# Patient Record
Sex: Male | Born: 1950 | Race: White | Hispanic: No | Marital: Married | Smoking: Never smoker
Health system: Southern US, Community
[De-identification: ages and names within clinical notes are randomized; demographics above are authoritative.]

## PROBLEM LIST (undated history)

## (undated) DIAGNOSIS — R519 Headache, unspecified: Secondary | ICD-10-CM

## (undated) DIAGNOSIS — C801 Malignant (primary) neoplasm, unspecified: Secondary | ICD-10-CM

## (undated) DIAGNOSIS — R42 Dizziness and giddiness: Secondary | ICD-10-CM

## (undated) DIAGNOSIS — K279 Peptic ulcer, site unspecified, unspecified as acute or chronic, without hemorrhage or perforation: Secondary | ICD-10-CM

## (undated) DIAGNOSIS — E785 Hyperlipidemia, unspecified: Secondary | ICD-10-CM

## (undated) HISTORY — DX: Headache, unspecified: R51.9

## (undated) HISTORY — DX: Peptic ulcer, site unspecified, unspecified as acute or chronic, without hemorrhage or perforation: K27.9

## (undated) HISTORY — PX: INGUINAL HERNIA REPAIR: SUR1180

## (undated) HISTORY — DX: Hyperlipidemia, unspecified: E78.5

## (undated) HISTORY — DX: Dizziness and giddiness: R42

## (undated) HISTORY — DX: Malignant (primary) neoplasm, unspecified: C80.1

---

## 2018-07-25 ENCOUNTER — Other Ambulatory Visit: Payer: Self-pay | Admitting: Family Medicine

## 2018-07-25 DIAGNOSIS — Z87891 Personal history of nicotine dependence: Secondary | ICD-10-CM

## 2018-08-08 ENCOUNTER — Ambulatory Visit
Admission: RE | Admit: 2018-08-08 | Discharge: 2018-08-08 | Disposition: A | Discharge status: Home / Self Care | Payer: Medicare Other | Source: Ambulatory Visit | Attending: Family Medicine | Admitting: Family Medicine

## 2018-08-08 DIAGNOSIS — Z87891 Personal history of nicotine dependence: Secondary | ICD-10-CM

## 2019-08-10 ENCOUNTER — Other Ambulatory Visit: Payer: Self-pay

## 2019-08-10 ENCOUNTER — Ambulatory Visit: Payer: Medicare Other | Attending: Family Medicine

## 2019-08-10 DIAGNOSIS — H8111 Benign paroxysmal vertigo, right ear: Secondary | ICD-10-CM | POA: Insufficient documentation

## 2019-08-10 DIAGNOSIS — H8112 Benign paroxysmal vertigo, left ear: Secondary | ICD-10-CM | POA: Insufficient documentation

## 2019-08-10 DIAGNOSIS — R42 Dizziness and giddiness: Secondary | ICD-10-CM | POA: Diagnosis present

## 2019-08-10 DIAGNOSIS — R2689 Other abnormalities of gait and mobility: Secondary | ICD-10-CM | POA: Diagnosis present

## 2019-08-10 NOTE — Therapy (Deleted)
[facility] [address] [place], [place], [address] Phone: [phone]   Fax:  [phone]  Physical Therapy Treatment  Patient Details  Name: Gordon Jacobs MRN: [id number] Date of Birth: 1951-02-13 Referring Provider (PT): Dr. Darlina Sicilian   Encounter Date: 08/10/2019  PT End of Session - 08/10/19 1525    Visit Number  1    Number of Visits  4    Date for PT Re-Evaluation  10/09/19    Authorization Type  [employer] Medicare: progress note every 10th visit. No auth required, VL based on medical necessity    PT Start Time  1443    PT Stop Time  1520    PT Time Calculation (min)  37 min    Equipment Utilized During Treatment  Other (comment)   S prn   Activity Tolerance  Patient tolerated treatment well    Behavior During Therapy  WFL for tasks assessed/performed       Past Medical History:  Diagnosis Date  . Peptic ulcer     History reviewed. No pertinent surgical history.  There were no vitals filed for this visit.  Subjective Assessment - 08/10/19 1451    Subjective  Pt reported dizziness began on 05/15/19, woke pt out of sleep and room was spinning. Pt's balance was impaired and it resolved in a few days. Pt has performed the Epley maneuver and it has helped. He did have a few episodes in September and October. Dizziness is worse in seated position and rolling to the L side. He did have an episode during amb. in yard, and fell against door frame (black L eye). Dizziness improves with rest and Epleys treatment. Dizziness at worst: 10/10, minor episodes: 5-6/10, at rest: 0/10. Pt denied weakness, severe HA (but minor HAs with dizziness-CT of brain to be scheduled by MD), tinnitus, N/T, or LOH. Pt reported Zyrtec is helping dizziness a little.    Pertinent History  Pepcid Ulcer    Patient Stated Goals  To figure out how to alleviate dizziness, how to manage it better.    Currently in Pain?  No/denies         [facility]  PT Assessment - 08/10/19 1457      Assessment   Medical Diagnosis  BPPV    Referring Provider (PT)  Dr. Darlina Sicilian    Onset Date/Surgical Date  05/15/19    Hand Dominance  Right    Prior Therapy  none      Precautions   Precautions  Fall   one fall, when dizzy     Restrictions   Weight Bearing Restrictions  No      Balance Screen   Has the patient fallen in the past 6 months  Yes    How many times?  1    Has the patient had a decrease in activity level because of a fear of falling?   Yes   more cautious and doesn't climb ladders   Is the patient reluctant to leave their home because of a fear of falling?   No      Home Environment   Living Environment  Private residence    Living Arrangements  Spouse/significant other   wife   Available Help at Discharge  Family;Available 24 hours/day    Type of Home  House    Home Access  Stairs to enter    Entrance Stairs-Number of Steps  3    Entrance Stairs-Rails  Can reach both    Home  Layout  Two level;Able to live on main level with bedroom/bathroom   basement   Home Equipment  None      Prior Function   Level of Independence  Independent    Vocation  Retired    [employer]  recently retired     Automotive engineer, play word games, reading, watch football and basketball, walking at least 2-3x/week (3-4 miles).      Cognition   Overall Cognitive Status  Within Functional Limits for tasks assessed      Ambulation/Gait   Ambulation/Gait  Yes    Ambulation/Gait Assistance  7: Independent    Ambulation Distance (Feet)  150 Feet    Assistive device  None    Gait Pattern  Within Functional Limits;Step-through pattern    Ambulation Surface  Level;Indoor    Gait velocity  3.67f/sec. no AD         Vestibular Assessment - 08/10/19 1506      Symptom Behavior   Subjective history of current problem  See assessment    Type of Dizziness   Spinning   at first and then feels like eyes are moving   Frequency of  Dizziness  A few times a month, intermittent    Duration of Dizziness  A few minutes, then wooziness    Symptom Nature  Positional    Aggravating Factors  Lying supine;Looking up to the ceiling;Rolling to left    Relieving Factors  Rest;Comments   Epley   Progression of Symptoms  Better    History of similar episodes  No      Oculomotor Exam   Oculomotor Alignment  Normal    Spontaneous  Absent    Gaze-induced   Absent    Head shaking Horizontal  Absent    Head Shaking Vertical  Absent    Smooth Pursuits  Intact    Saccades  Intact    Comment  No dizziness reported during oculomotor exam. Convergence WNL.      Vestibulo-Ocular Reflex   VOR 1 Head Only (x 1 viewing)  WNL and no dizziness reported.     VOR Cancellation  Normal      Positional Testing   Dix-Hallpike  Dix-Hallpike Right;Dix-Hallpike Left    Horizontal Canal Testing  Horizontal Canal Right;Horizontal Canal Left      Dix-Hallpike Right   Dix-Hallpike Right Duration  0    Dix-Hallpike Right Symptoms  No nystagmus      Dix-Hallpike Left   Dix-Hallpike Left Duration  0    Dix-Hallpike Left Symptoms  No nystagmus      Horizontal Canal Right   Horizontal Canal Right Duration  0    Horizontal Canal Right Symptoms  Normal      Horizontal Canal Left   Horizontal Canal Left Duration  0    Horizontal Canal Left Symptoms  Normal                       PT Education - 08/10/19 1523    Education Details  PT educated pt on exam findings, PT POC, duration, frequency. PT educated pt on vertigo appearing to be resolved at this time but will hold pt for now and pt is to call back to schedule appt. for f/u if dizziness returns in the next month (will keep episode open for two months max).    Person(s) Educated  Patient    Methods  Explanation    Comprehension  Verbalized understanding  PT Short Term Goals - 08/10/19 1530      PT SHORT TERM GOAL #1   Title  same as LTGs        PT Long Term Goals  - 08/10/19 1530      PT LONG TERM GOAL #1   Title  Perform positional testing and write goals as indicated. TARGET DATE FOR ALL LTGS:09/07/19    Time  4    Period  Weeks    Status  New      PT LONG TERM GOAL #2   Title  Perform FGA and write goal as indicated.    Time  4    Period  Weeks    Status  New      PT LONG TERM GOAL #3   Title  Pt will report no dizziness for 2 weeks to improve QOL and safety during functional mobility.    Time  4    Period  Weeks    Status  New            Plan - 08/10/19 1527    Clinical Impression Statement  Pt is a pleasant 68y/o male presenting to OPPT neuro for BPPV. Pt's PMH is significant for the following: pepcid ulcer. Pt's exam findings were negative for dizziness, as pt appears it has resolved with pt performing Epley maneuver at home. Pt's gait speed was WNL, and indicated he's able to safely amb. in the community. Pt's only fall was 2/2 dizziness. PT will hold this episode open for two months, and pt can come in for treatment at that time. Pt would benefit from skilled therapy if dizziness returns to ensure safety.    Personal Factors and Comorbidities  Comorbidity 1    Examination-Activity Limitations  Bed Mobility;Locomotion Level    Examination-Participation Restrictions  Yard Work    Stability/Clinical Decision Making  Stable/Uncomplicated    Clinical Decision Making  Low    Rehab Potential  Excellent    PT Frequency  1x / week    PT Duration  3 weeks    PT Treatment/Interventions  ADLs/Self Care Home Management;Neuromuscular re-education;Canalith Repostioning;Balance training;Therapeutic exercise;Therapeutic activities;Functional mobility training;Stair training;Gait training;Vestibular;Patient/family education    PT Next Visit Plan  Reassess for BPPV if dizziness returns and perform FGA as indicated.    Consulted and Agree with Plan of Care  Patient       Patient will benefit from skilled therapeutic intervention in order to  improve the following deficits and impairments:  Dizziness, Decreased balance  Visit Diagnosis: Dizziness and giddiness  BPPV (benign paroxysmal positional vertigo), left  BPPV (benign paroxysmal positional vertigo), right  Other abnormalities of gait and mobility     Problem List There are no active problems to display for this patient.   Abed Schar L 08/10/2019, 3:32 PM  [facility] [address] [place], [place], [address] Phone: [phone]   Fax:  [phone]  Name: Eliot Popper MRN: [id number] Date of Birth: 1951/01/13

## 2019-08-10 NOTE — Therapy (Signed)
[facility] [address] [place], [place], [address] Phone: [phone]   Fax:  [phone]  Physical Therapy Evaluation  Patient Details  Name: Gordon Jacobs MRN: [id number] Date of Birth: 26-Dec-1950 Referring Provider (PT): Dr. Darlina Sicilian   Encounter Date: 08/10/2019  PT End of Session - 08/10/19 1525    Visit Number  1    Number of Visits  4    Date for PT Re-Evaluation  10/09/19    Authorization Type  [employer] Medicare: progress note every 10th visit. No auth required, VL based on medical necessity    PT Start Time  1443    PT Stop Time  1520    PT Time Calculation (min)  37 min    Equipment Utilized During Treatment  Other (comment)   S prn   Activity Tolerance  Patient tolerated treatment well    Behavior During Therapy  WFL for tasks assessed/performed       Past Medical History:  Diagnosis Date  . Peptic ulcer     History reviewed. No pertinent surgical history.  There were no vitals filed for this visit.   Subjective Assessment - 08/10/19 1451    Subjective  Pt reported dizziness began on 05/15/19, woke pt out of sleep and room was spinning. Pt's balance was impaired and it resolved in a few days. Pt reported last dizzy episode was a few weeks ago. Pt has performed the Epley maneuver and it has helped. He did have a few episodes in September and October. Dizziness is worse in seated position and rolling to the L side. He did have an episode during amb. in yard, and fell against door frame (black L eye). Dizziness improves with rest and Epleys treatment. Dizziness at worst: 10/10, minor episodes: 5-6/10, at rest: 0/10. Pt denied weakness, severe HA (but minor HAs with dizziness-CT of brain to be scheduled by MD), tinnitus, N/T, or LOH. Pt reported Zyrtec is helping dizziness a little.    Pertinent History  Pepcid Ulcer    Patient Stated Goals  To figure out how to alleviate dizziness, how to manage it  better.    Currently in Pain?  No/denies         [facility] PT Assessment - 08/10/19 1457      Assessment   Medical Diagnosis  BPPV    Referring Provider (PT)  Dr. Darlina Sicilian    Onset Date/Surgical Date  05/15/19    Hand Dominance  Right    Prior Therapy  none      Precautions   Precautions  Fall   one fall, when dizzy     Restrictions   Weight Bearing Restrictions  No      Balance Screen   Has the patient fallen in the past 6 months  Yes    How many times?  1    Has the patient had a decrease in activity level because of a fear of falling?   Yes   more cautious and doesn't climb ladders   Is the patient reluctant to leave their home because of a fear of falling?   No      Home Environment   Living Environment  Private residence    Living Arrangements  Spouse/significant other   wife   Available Help at Discharge  Family;Available 24 hours/day    Type of Home  House    Home Access  Stairs to enter    Entrance Stairs-Number of Steps  3  Entrance Stairs-Rails  Can reach both    Home Layout  Two level;Able to live on main level with bedroom/bathroom   basement   Home Equipment  None      Prior Function   Level of Independence  Independent    Vocation  Retired    [employer]  recently retired     Automotive engineer, play word games, reading, watch football and basketball, walking at least 2-3x/week (3-4 miles).      Cognition   Overall Cognitive Status  Within Functional Limits for tasks assessed      Ambulation/Gait   Ambulation/Gait  Yes    Ambulation/Gait Assistance  7: Independent    Ambulation Distance (Feet)  150 Feet    Assistive device  None    Gait Pattern  Within Functional Limits;Step-through pattern    Ambulation Surface  Level;Indoor    Gait velocity  3.53f/sec. no AD           Vestibular Assessment - 08/10/19 1506      Symptom Behavior   Subjective history of current problem  See assessment    Type of Dizziness   Spinning   at  first and then feels like eyes are moving   Frequency of Dizziness  A few times a month, intermittent    Duration of Dizziness  A few minutes, then wooziness    Symptom Nature  Positional    Aggravating Factors  Lying supine;Looking up to the ceiling;Rolling to left    Relieving Factors  Rest;Comments   Epley   Progression of Symptoms  Better    History of similar episodes  No      Oculomotor Exam   Oculomotor Alignment  Normal    Spontaneous  Absent    Gaze-induced   Absent    Head shaking Horizontal  Absent    Head Shaking Vertical  Absent    Smooth Pursuits  Intact    Saccades  Intact    Comment  No dizziness reported during oculomotor exam. Convergence WNL.      Vestibulo-Ocular Reflex   VOR 1 Head Only (x 1 viewing)  WNL and no dizziness reported.     VOR Cancellation  Normal      Positional Testing   Dix-Hallpike  Dix-Hallpike Right;Dix-Hallpike Left    Horizontal Canal Testing  Horizontal Canal Right;Horizontal Canal Left      Dix-Hallpike Right   Dix-Hallpike Right Duration  0    Dix-Hallpike Right Symptoms  No nystagmus      Dix-Hallpike Left   Dix-Hallpike Left Duration  0    Dix-Hallpike Left Symptoms  No nystagmus      Horizontal Canal Right   Horizontal Canal Right Duration  0    Horizontal Canal Right Symptoms  Normal      Horizontal Canal Left   Horizontal Canal Left Duration  0    Horizontal Canal Left Symptoms  Normal          Objective measurements completed on examination: See above findings.              PT Education - 08/10/19 1523    Education Details  PT educated pt on exam findings, PT POC, duration, frequency. PT educated pt on vertigo appearing to be resolved at this time but will hold pt for now and pt is to call back to schedule appt. for f/u if dizziness returns in the next month (will keep episode open for two months max).  Person(s) Educated  Patient    Methods  Explanation    Comprehension  Verbalized understanding        PT Short Term Goals - 08/10/19 1530      PT SHORT TERM GOAL #1   Title  same as LTGs        PT Long Term Goals - 08/10/19 1530      PT LONG TERM GOAL #1   Title  Perform positional testing and write goals as indicated. TARGET DATE FOR ALL LTGS:09/07/19    Time  4    Period  Weeks    Status  New      PT LONG TERM GOAL #2   Title  Perform FGA and write goal as indicated.    Time  4    Period  Weeks    Status  New      PT LONG TERM GOAL #3   Title  Pt will report no dizziness for 2 weeks to improve QOL and safety during functional mobility.    Time  4    Period  Weeks    Status  New             Plan - 08/10/19 1527    Clinical Impression Statement  Pt is a pleasant 68y/o male presenting to OPPT neuro for BPPV. Pt's PMH is significant for the following: pepcid ulcer. Pt's exam findings were negative for dizziness, as pt appears it has resolved with pt performing Epley maneuver at home. Pt's gait speed was WNL, and indicated he's able to safely amb. in the community. Pt's only fall was 2/2 dizziness. PT will hold this episode open for two months, and pt can come in for treatment at that time. Pt would benefit from skilled therapy if dizziness returns to ensure safety.    Personal Factors and Comorbidities  Comorbidity 1    Examination-Activity Limitations  Bed Mobility;Locomotion Level    Examination-Participation Restrictions  Yard Work    Stability/Clinical Decision Making  Stable/Uncomplicated    Clinical Decision Making  Low    Rehab Potential  Excellent    PT Frequency  1x / week    PT Duration  3 weeks    PT Treatment/Interventions  ADLs/Self Care Home Management;Neuromuscular re-education;Canalith Repostioning;Balance training;Therapeutic exercise;Therapeutic activities;Functional mobility training;Stair training;Gait training;Vestibular;Patient/family education    PT Next Visit Plan  Reassess for BPPV if dizziness returns and perform FGA as indicated.     Consulted and Agree with Plan of Care  Patient       Patient will benefit from skilled therapeutic intervention in order to improve the following deficits and impairments:  Dizziness, Decreased balance  Visit Diagnosis: Dizziness and giddiness  BPPV (benign paroxysmal positional vertigo), left  BPPV (benign paroxysmal positional vertigo), right  Other abnormalities of gait and mobility     Problem List There are no active problems to display for this patient.   Arsh Feutz L 08/10/2019, 3:33 PM  [facility] [address] [place], [place], [address] Phone: [phone]   Fax:  [phone]  Name: Tryce Surratt MRN: [id number] Date of Birth: 1951/03/04   Geoffry Paradise, PT,DPT 08/10/19 3:34 PM Phone: [phone] Fax: [phone]

## 2019-08-18 ENCOUNTER — Other Ambulatory Visit: Payer: Self-pay | Admitting: Family Medicine

## 2019-08-18 DIAGNOSIS — R42 Dizziness and giddiness: Secondary | ICD-10-CM

## 2019-08-27 ENCOUNTER — Ambulatory Visit
Admission: RE | Admit: 2019-08-27 | Discharge: 2019-08-27 | Disposition: A | Discharge status: Home / Self Care | Payer: Medicare Other | Source: Ambulatory Visit | Attending: Family Medicine | Admitting: Family Medicine

## 2019-08-27 DIAGNOSIS — R42 Dizziness and giddiness: Secondary | ICD-10-CM

## 2019-09-08 ENCOUNTER — Other Ambulatory Visit: Payer: Self-pay

## 2019-09-08 ENCOUNTER — Ambulatory Visit: Payer: Medicare Other | Admitting: Internal Medicine

## 2019-09-08 ENCOUNTER — Encounter: Payer: Self-pay | Admitting: Internal Medicine

## 2019-09-08 VITALS — BP 126/72 | HR 78 | Temp 97.3°F | Ht 75.0 in | Wt 211.0 lb

## 2019-09-08 DIAGNOSIS — K921 Melena: Secondary | ICD-10-CM

## 2019-09-08 DIAGNOSIS — Z1211 Encounter for screening for malignant neoplasm of colon: Secondary | ICD-10-CM

## 2019-09-08 DIAGNOSIS — K625 Hemorrhage of anus and rectum: Secondary | ICD-10-CM

## 2019-09-08 DIAGNOSIS — R1013 Epigastric pain: Secondary | ICD-10-CM | POA: Diagnosis not present

## 2019-09-08 DIAGNOSIS — Z1159 Encounter for screening for other viral diseases: Secondary | ICD-10-CM

## 2019-09-08 MED ORDER — SUPREP BOWEL PREP KIT 17.5-3.13-1.6 GM/177ML PO SOLN: ORAL | 0 refills | Status: DC

## 2019-09-08 NOTE — Patient Instructions (Addendum)
If you are age 66 or older, your body mass index should be between 23-30. Your Body mass index is 26.37 kg/m. If this is out of the aforementioned range listed, please consider follow up with your Primary Care Provider.  You have been scheduled for an endoscopy and colonoscopy. Please follow the written instructions given to you at your visit today. Please pick up your prep supplies at the pharmacy within the next 1-3 days. If you use inhalers (even only as needed), please bring them with you on the day of your procedure. Your physician has requested that you go to www.startemmi.com and enter the access code given to you at your visit today. This web site gives a general overview about your procedure. However, you should still follow specific instructions given to you by our office regarding your preparation for the procedure.  Continue your Pantoprazole 40mg  once daily.  Please avoid all NSAIDs.   Thank you for entrusting me with your care and for choosing [facility], Dr. Zenovia Jarred

## 2019-09-08 NOTE — Progress Notes (Signed)
Patient ID: Gordon Jacobs, male   DOB: Sep 09, 1951, 68 y.o.   MRN: [id number] HPI: Gordon Jacobs is a 68 year old male with recent epigastric abdominal pain with black tarry stool who is seen in consult at the request of Dr. Darlina Sicilian to discuss epigastric abdominal pain, dark stools and rare rectal bleeding.  He is here alone today.  He reports he had a screening colonoscopy years ago around age 45.  Over the last few months he has developed epigastric abdominal pain and black tarry stool.  Earlier this month he was started on pantoprazole 40 mg a day per primary care.  With this his epigastric pain and melena have resolved.  Prior to this he was having rare heartburn 1 to 2 days/month.  This is also resolved.  He will occasionally see red blood with wiping and in the toilet.  His bowel movements occur 1-2 times in the morning and have been consistent.  No dramatic changes in bowel habit.  No constipation.  Stable weight and appetite.  No esophageal dysphagia or odynophagia.  He was previously using Advil 2 or 3 times per week but has stopped this and is using Tylenol for joint pains as needed.  No family history of colon cancer.  His brother recently had a precancerous polyp at age 20.  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  Past smoker.  Occasional alcohol use.  Prior inguinal hernia repair  Past Medical History:  Diagnosis Date  . Borderline hyperlipidemia   . Frequent headaches   . Peptic ulcer   . Vertigo     Past Surgical History:  Procedure Laterality Date  . INGUINAL HERNIA REPAIR      Outpatient Medications Prior to Visit  Medication Sig Dispense Refill  . meclizine (ANTIVERT) 25 MG tablet Take 25 mg by mouth 3 (three) times daily as needed for dizziness.    . pantoprazole (PROTONIX) 40 MG tablet Take 40 mg by mouth daily.    . cetirizine (ZYRTEC) 10 MG tablet Take 10 mg by mouth daily.    . naproxen  sodium (ALEVE) 220 MG tablet Take 220 mg by mouth daily as needed.    . vitamin B-12 (CYANOCOBALAMIN) 100 MCG tablet Take 100 mcg by mouth daily.     No facility-administered medications prior to visit.     Allergies  Allergen Reactions  . Strawberry (Diagnostic) Anaphylaxis    Family History  Problem Relation Age of Onset  . Stomach cancer Mother   . Liver cancer Mother   . Ovarian cancer Mother   . Breast cancer Sister   . Colon polyps Brother     Social History   Tobacco Use  . Smoking status: Never Smoker  . Smokeless tobacco: Never Used  Substance Use Topics  . Alcohol use: Yes    Comment: at most, 5  beers in a week  . Drug use: Never    ROS: As per history of present illness, otherwise negative  BP 126/72   Pulse 78   Temp (!) 97.3 F (36.3 C)   Ht 6\' 3"  (1.905 m)   Wt 211 lb (95.7 kg)   BMI 26.37 kg/m  Constitutional: Well-developed and well-nourished. No distress. HEENT: Normocephalic and atraumatic.Conjunctivae are normal.  No scleral icterus. Neck: Neck supple. Trachea midline. Cardiovascular: Normal rate, regular rhythm and intact distal pulses. No M/R/G Pulmonary/chest: Effort normal and breath sounds normal. No wheezing, rales or rhonchi. Abdominal: Soft, nontender,  nondistended. Bowel sounds active throughout. There are no masses palpable. No hepatosplenomegaly. Extremities: no clubbing, cyanosis, or edema Neurological: Alert and oriented to person place and time. Skin: Skin is warm and dry.  Psychiatric: Normal mood and affect. Behavior is normal.   ASSESSMENT/PLAN: 68 year old male with recent epigastric abdominal pain with black tarry stool who is seen in consult at the request of Dr. Darlina Sicilian to discuss epigastric abdominal pain, dark stools and rare rectal bleeding.  1. Epigastric pain/heartburn/melena --he was diagnosed clinically with ulcer and started on PPI nearly 1 month ago.  Symptoms have improved.  I recommended we pursue  upper endoscopy to evaluate the esophagus, stomach and duodenum to rule out esophagitis, gastroduodenitis, peptic ulcer disease and H. pylori.  We discussed the risk, benefits and alternatives and he is agreeable and wishes to proceed --Upper endoscopy --Continue pantoprazole 40 mg daily --Continue NSAID avoidance  2.  Colon cancer screening/rare rectal bleeding --I recommended colonoscopy for screening.  His painless red blood is intermittent and may be hemorrhoidal in nature but certainly colonoscopy is warranted.  We discussed the risk, benefits and alternatives and he is agreeable and wishes to proceed      [id number], [place], [place],  [place] [id number]

## 2019-09-10 ENCOUNTER — Encounter: Payer: Self-pay | Admitting: Internal Medicine

## 2019-09-15 ENCOUNTER — Other Ambulatory Visit: Payer: Self-pay | Admitting: Internal Medicine

## 2019-09-15 ENCOUNTER — Ambulatory Visit (INDEPENDENT_AMBULATORY_CARE_PROVIDER_SITE_OTHER): Payer: Medicare Other

## 2019-09-15 DIAGNOSIS — Z1159 Encounter for screening for other viral diseases: Secondary | ICD-10-CM

## 2019-09-16 LAB — SARS CORONAVIRUS 2 (TAT 6-24 HRS): SARS Coronavirus 2: NEGATIVE

## 2019-09-17 ENCOUNTER — Other Ambulatory Visit: Payer: Self-pay

## 2019-09-17 ENCOUNTER — Encounter: Payer: Self-pay | Admitting: Internal Medicine

## 2019-09-17 ENCOUNTER — Ambulatory Visit (AMBULATORY_SURGERY_CENTER): Payer: Medicare Other | Admitting: Internal Medicine

## 2019-09-17 VITALS — BP 118/70 | HR 75 | Temp 97.6°F | Resp 20 | Ht 75.0 in | Wt 211.0 lb

## 2019-09-17 DIAGNOSIS — D123 Benign neoplasm of transverse colon: Secondary | ICD-10-CM | POA: Diagnosis not present

## 2019-09-17 DIAGNOSIS — K2970 Gastritis, unspecified, without bleeding: Secondary | ICD-10-CM

## 2019-09-17 DIAGNOSIS — Z1211 Encounter for screening for malignant neoplasm of colon: Secondary | ICD-10-CM | POA: Diagnosis not present

## 2019-09-17 DIAGNOSIS — B9681 Helicobacter pylori [H. pylori] as the cause of diseases classified elsewhere: Secondary | ICD-10-CM | POA: Diagnosis not present

## 2019-09-17 DIAGNOSIS — D128 Benign neoplasm of rectum: Secondary | ICD-10-CM | POA: Diagnosis not present

## 2019-09-17 DIAGNOSIS — K2950 Unspecified chronic gastritis without bleeding: Secondary | ICD-10-CM

## 2019-09-17 DIAGNOSIS — D124 Benign neoplasm of descending colon: Secondary | ICD-10-CM

## 2019-09-17 DIAGNOSIS — D125 Benign neoplasm of sigmoid colon: Secondary | ICD-10-CM

## 2019-09-17 DIAGNOSIS — K2980 Duodenitis without bleeding: Secondary | ICD-10-CM | POA: Diagnosis not present

## 2019-09-17 DIAGNOSIS — R1013 Epigastric pain: Secondary | ICD-10-CM

## 2019-09-17 DIAGNOSIS — K921 Melena: Secondary | ICD-10-CM

## 2019-09-17 MED ORDER — SODIUM CHLORIDE 0.9 % IV SOLN: 500.0000 mL | Freq: Once | INTRAVENOUS | Status: DC

## 2019-09-17 NOTE — Op Note (Addendum)
[place] Patient Name: Gordon Jacobs Procedure Date: 09/17/2019 10:05 AM MRN: [id number] Endoscopist: Jerene Bears , MD Age: 68 Referring MD:  Date of Birth: 11/25/1950 Gender: Male Account #: [id number] Procedure:                Colonoscopy Indications:              Screening for colorectal malignant neoplasm (last                            colonoscopy > 10 years ago) Medicines:                Monitored Anesthesia Care Procedure:                Pre-Anesthesia Assessment:                           - Prior to the procedure, a History and Physical                            was performed, and patient medications and                            allergies were reviewed. The patient's tolerance of                            previous anesthesia was also reviewed. The risks                            and benefits of the procedure and the sedation                            options and risks were discussed with the patient.                            All questions were answered, and informed consent                            was obtained. Prior Anticoagulants: The patient has                            taken no previous anticoagulant or antiplatelet                            agents. ASA Grade Assessment: II - A patient with                            mild systemic disease. After reviewing the risks                            and benefits, the patient was deemed in                            satisfactory condition to undergo the procedure.                           -  Prior to the procedure, a History and Physical                            was performed, and patient medications and                            allergies were reviewed. The patient's tolerance of                            previous anesthesia was also reviewed. The risks                            and benefits of the procedure and the sedation                            options and risks were discussed with the  patient.                            All questions were answered, and informed consent                            was obtained. Prior Anticoagulants: The patient has                            taken no previous anticoagulant or antiplatelet                            agents. ASA Grade Assessment: II - A patient with                            mild systemic disease. After reviewing the risks                            and benefits, the patient was deemed in                            satisfactory condition to undergo the procedure.                           After obtaining informed consent, the colonoscope                            was passed under direct vision. Throughout the                            procedure, the patient's blood pressure, pulse, and                            oxygen saturations were monitored continuously. The                            Colonoscope was introduced through the anus and  advanced to the cecum, identified by appendiceal                            orifice and ileocecal valve. The colonoscopy was                            performed without difficulty. The patient tolerated                            the procedure well. The quality of the bowel                            preparation was good. The ileocecal valve,                            appendiceal orifice, and rectum were photographed. Scope In: 10:18:12 AM Scope Out: 10:42:06 AM Scope Withdrawal Time: 0 hours 21 minutes 35 seconds  Total Procedure Duration: 0 hours 23 minutes 54 seconds  Findings:                 Hemorrhoids were found on perianal exam.                           Two sessile polyps were found in the transverse                            colon. The polyps were 4 to 6 mm in size. These                            polyps were removed with a cold snare. Resection                            and retrieval were complete.                           A 5 mm polyp was  found in the descending colon. The                            polyp was sessile. The polyp was removed with a                            cold snare. Resection and retrieval were complete.                           Two pedunculated polyps were found in the sigmoid                            colon. The polyps were 7 to 10 mm in size. These                            polyps were removed with a hot snare. Resection and  retrieval were complete.                           A 7 mm polyp was found in the rectum. The polyp was                            pedunculated. The polyp was removed with a hot                            snare. Resection and retrieval were complete.                           Many small and large-mouthed diverticula were found                            in the sigmoid colon and descending colon.                           External and internal hemorrhoids were found during                            retroflexion and during digital exam. Complications:            No immediate complications. Estimated Blood Loss:     Estimated blood loss was minimal. Impression:               - Hemorrhoids found on perianal exam.                           - Two 4 to 6 mm polyps in the transverse colon,                            removed with a cold snare. Resected and retrieved.                           - One 5 mm polyp in the descending colon, removed                            with a cold snare. Resected and retrieved.                           - Two 7 to 10 mm polyps in the sigmoid colon,                            removed with a hot snare. Resected and retrieved.                           - One 7 mm polyp in the rectum, removed with a hot                            snare. Resected and retrieved.                           - Severe  diverticulosis in the sigmoid colon and in                            the descending colon.                           - External and  internal hemorrhoids. Recommendation:           - Patient has a contact number available for                            emergencies. The signs and symptoms of potential                            delayed complications were discussed with the                            patient. Return to normal activities tomorrow.                            Written discharge instructions were provided to the                            patient.                           - Resume previous diet.                           - Continue present medications.                           - Await pathology results.                           - Repeat colonoscopy is recommended for                            surveillance. The colonoscopy date will be                            determined after pathology results from today's                            exam become available for review.                           - No ibuprofen, naproxen, or other non-steroidal                            anti-inflammatory drugs for 2 weeks after polyp                            removal. Jerene Bears, MD 09/17/2019 10:51:10 AM This report has been signed electronically.

## 2019-09-17 NOTE — Progress Notes (Signed)
PT taken to PACU. Monitors in place. VSS. Report given to RN. 

## 2019-09-17 NOTE — Patient Instructions (Signed)
6 polyps removed today.  Dr Hilarie Fredrickson will mail you a letter when he gets the pathology report.  Usually it takes 1-3 weeks to receive this letter. Diverticulosis was noted.  Avoid constipation to keep these pouches healthy. You have gastritis in your stomach.  You were tested for an infection.  We will have those results in the above mentioned pathology.    YOU HAD AN ENDOSCOPIC PROCEDURE TODAY AT [facility]:   Refer to the procedure report that was given to you for any specific questions about what was found during the examination.  If the procedure report does not answer your questions, please call your gastroenterologist to clarify.  If you requested that your care partner not be given the details of your procedure findings, then the procedure report has been included in a sealed envelope for you to review at your convenience later.  YOU SHOULD EXPECT: Some feelings of bloating in the abdomen. Passage of more gas than usual.  Walking can help get rid of the air that was put into your GI tract during the procedure and reduce the bloating. If you had a lower endoscopy (such as a colonoscopy or flexible sigmoidoscopy) you may notice spotting of blood in your stool or on the toilet paper. If you underwent a bowel prep for your procedure, you may not have a normal bowel movement for a few days.  Please Note:  You might notice some irritation and congestion in your nose or some drainage.  This is from the oxygen used during your procedure.  There is no need for concern and it should clear up in a day or so.  SYMPTOMS TO REPORT IMMEDIATELY:   Following lower endoscopy (colonoscopy or flexible sigmoidoscopy):  Excessive amounts of blood in the stool  Significant tenderness or worsening of abdominal pains  Swelling of the abdomen that is new, acute  Fever of 100F or higher   Following upper endoscopy (EGD)  Vomiting of blood or coffee ground material  New chest pain or pain under  the shoulder blades  Painful or persistently difficult swallowing  New shortness of breath  Fever of 100F or higher  Black, tarry-looking stools  For urgent or emergent issues, a gastroenterologist can be reached at any hour by calling [phone].   DIET:  We do recommend a small meal at first, but then you may proceed to your regular diet.  Drink plenty of fluids but you should avoid alcoholic beverages for 24 hours.  ACTIVITY:  You should plan to take it easy for the rest of today and you should NOT DRIVE or use heavy machinery until tomorrow (because of the sedation medicines used during the test).    FOLLOW UP: Our staff will call the number listed on your records 48-72 hours following your procedure to check on you and address any questions or concerns that you may have regarding the information given to you following your procedure. If we do not reach you, we will leave a message.  We will attempt to reach you two times.  During this call, we will ask if you have developed any symptoms of COVID 19. If you develop any symptoms (ie: fever, flu-like symptoms, shortness of breath, cough etc.) before then, please call [phone].  If you test positive for Covid 19 in the 2 weeks post procedure, please call and report this information to [place].    If any biopsies were taken you will be contacted by phone or by letter  within the next 1-3 weeks.  Please call us at [phone] if you have not heard about the biopsies in 3 weeks.    SIGNATURES/CONFIDENTIALITY: You and/or your care partner have signed paperwork which will be entered into your electronic medical record.  These signatures attest to the fact that that the information above on your After Visit Summary has been reviewed and is understood.  Full responsibility of the confidentiality of this discharge information lies with you and/or your care-partner.

## 2019-09-17 NOTE — Progress Notes (Signed)
Called to room to assist during endoscopic procedure.  Patient ID and intended procedure confirmed with present staff. Received instructions for my participation in the procedure from the performing physician.  

## 2019-09-17 NOTE — Progress Notes (Addendum)
Temp taken by JB VS taken by DT 

## 2019-09-17 NOTE — Op Note (Signed)
[name] Procedure Date: 09/17/2019 10:05 AM MRN: [id number] Endoscopist: Jerene Bears , MD Age: 68 Referring MD:  Date of Birth: 1951/09/08 Gender: Male Account #: [id number] Procedure:                Upper GI endoscopy Indications:              Epigastric abdominal pain, Heartburn, Melena Medicines:                Monitored Anesthesia Care Procedure:                Pre-Anesthesia Assessment:                           - Prior to the procedure, a History and Physical                            was performed, and patient medications and                            allergies were reviewed. The patient's tolerance of                            previous anesthesia was also reviewed. The risks                            and benefits of the procedure and the sedation                            options and risks were discussed with the patient.                            All questions were answered, and informed consent                            was obtained. Prior Anticoagulants: The patient has                            taken no previous anticoagulant or antiplatelet                            agents. ASA Grade Assessment: II - A patient with                            mild systemic disease. After reviewing the risks                            and benefits, the patient was deemed in                            satisfactory condition to undergo the procedure.                           After obtaining informed consent, the endoscope was  passed under direct vision. Throughout the                            procedure, the patient's blood pressure, pulse, and                            oxygen saturations were monitored continuously. The                            Endoscope was introduced through the mouth, and                            advanced to the third part of duodenum. The upper                            GI endoscopy  was accomplished without difficulty.                            The patient tolerated the procedure well. Scope In: Scope Out: Findings:                 The examined esophagus was normal.                           The gastroesophageal flap valve was visualized                            endoscopically and classified as Hill Grade II                            (fold present, opens with respiration).                           Mild inflammation characterized by erythema was                            found in the cardia and in the gastric fundus.                            Biopsies were taken with a cold forceps for                            histology and Helicobacter pylori testing.                           Mild inflammation characterized by erythema was                            found in the duodenal bulb.                           The second portion of the duodenum was normal. Complications:            No immediate complications. Estimated Blood Loss:     Estimated blood loss was minimal. Impression:               -  Normal esophagus.                           - Gastritis. Biopsied.                           - Duodenitis.                           - Normal second portion of the duodenum. Recommendation:           - Patient has a contact number available for                            emergencies. The signs and symptoms of potential                            delayed complications were discussed with the                            patient. Return to normal activities tomorrow.                            Written discharge instructions were provided to the                            patient.                           - Resume previous diet.                           - Continue present medications including                            pantoprazole until pathology results available.                           - Await pathology results. Jerene Bears, MD 09/17/2019 10:47:01 AM This report  has been signed electronically.

## 2019-09-21 ENCOUNTER — Telehealth: Payer: Self-pay | Admitting: *Deleted

## 2019-09-21 NOTE — Telephone Encounter (Signed)
  Follow up Call-  Call back number 09/17/2019  Post procedure Call Back phone  # [phone]  Permission to leave phone message Yes  Some recent data might be hidden     Patient questions:  Do you have a fever, pain , or abdominal swelling? No. Pain Score  0 *  Have you tolerated food without any problems? Yes.    Have you been able to return to your normal activities? Yes.    Do you have any questions about your discharge instructions: Diet   No. Medications  No. Follow up visit  No.  Do you have questions or concerns about your Care? No.  Actions: * If pain score is 4 or above: No action needed, pain <4.  1. Have you developed a fever since your procedure? no  2.   Have you had an respiratory symptoms (SOB or cough) since your procedure? no  3.   Have you tested positive for COVID 19 since your procedure no  4.   Have you had any family members/close contacts diagnosed with the COVID 19 since your procedure?  no   If yes to any of these questions please route to Joylene John, RN and Alphonsa Gin, Therapist, sports.

## 2019-09-23 ENCOUNTER — Encounter: Payer: Self-pay | Admitting: Internal Medicine

## 2019-09-25 ENCOUNTER — Other Ambulatory Visit: Payer: Self-pay

## 2019-09-25 MED ORDER — PANTOPRAZOLE SODIUM 40 MG PO TBEC: 40.0000 mg | DELAYED_RELEASE_TABLET | Freq: Two times a day (BID) | ORAL | 0 refills | Status: DC

## 2019-09-25 MED ORDER — BIS SUBCIT-METRONID-TETRACYC 140-125-125 MG PO CAPS: 3.0000 | ORAL_CAPSULE | Freq: Three times a day (TID) | ORAL | 0 refills | Status: DC

## 2019-09-28 ENCOUNTER — Telehealth: Payer: Self-pay | Admitting: *Deleted

## 2019-09-28 ENCOUNTER — Encounter: Payer: Self-pay | Admitting: *Deleted

## 2019-09-28 MED ORDER — METRONIDAZOLE 250 MG PO TABS: 250.0000 mg | ORAL_TABLET | Freq: Two times a day (BID) | ORAL | 0 refills | Status: AC

## 2019-09-28 MED ORDER — BISMUTH SUBSALICYLATE 262 MG PO CHEW: 524.0000 mg | CHEWABLE_TABLET | Freq: Four times a day (QID) | ORAL | 0 refills | Status: AC

## 2019-09-28 MED ORDER — DOXYCYCLINE HYCLATE 100 MG PO TABS: 100.0000 mg | ORAL_TABLET | Freq: Two times a day (BID) | ORAL | 0 refills | Status: AC

## 2019-09-28 MED ORDER — OMEPRAZOLE 40 MG PO CPDR: 40.0000 mg | DELAYED_RELEASE_CAPSULE | Freq: Two times a day (BID) | ORAL | 0 refills | Status: DC

## 2019-09-28 NOTE — Telephone Encounter (Signed)
I have spoken to patient to advise that his insurance will not cover Pylera. Unfortunately, we will have to break down Pylera into several different medications. Rx's have been sent to pharmacy. Patient was already sent and picked up rx for pantoprazole so he will take that rather than omeprazole PPI usually given. I have contacted the pharmacy to discontinue pylera script. I have also sent a mychart message to patient with a breakdown of medications and how to take them. He indicates he will call us back with any questions.

## 2019-10-14 ENCOUNTER — Other Ambulatory Visit: Payer: Self-pay | Admitting: Internal Medicine

## 2019-10-14 ENCOUNTER — Other Ambulatory Visit: Payer: Self-pay

## 2019-10-14 MED ORDER — PANTOPRAZOLE SODIUM 40 MG PO TBEC: 40.0000 mg | DELAYED_RELEASE_TABLET | Freq: Every day | ORAL | 1 refills | Status: DC

## 2019-12-08 ENCOUNTER — Other Ambulatory Visit: Payer: Self-pay

## 2019-12-08 DIAGNOSIS — A048 Other specified bacterial intestinal infections: Secondary | ICD-10-CM

## 2019-12-24 ENCOUNTER — Other Ambulatory Visit: Payer: Medicare Other

## 2019-12-24 DIAGNOSIS — A048 Other specified bacterial intestinal infections: Secondary | ICD-10-CM

## 2019-12-25 LAB — HELICOBACTER PYLORI  SPECIAL ANTIGEN
MICRO NUMBER:: 10266188
SPECIMEN QUALITY: ADEQUATE

## 2021-03-23 IMAGING — CT CT HEAD W/O CM
3 of 4 series · 15 of 47 positions shown, 18 images · non-contrast
Comparison: None.

CLINICAL DATA: Multiple episodes of vertigo and headache for 3
months.

EXAM:
CT HEAD WITHOUT CONTRAST
TECHNIQUE: Contiguous axial images were obtained from the base of the skull
through the vertex without intravenous contrast.

[Series 2: head 5.00 hr40 s3 axial ibhc · axial · 0.51mm/px · z∈[-572,-417]mm · 9 of 37 slices shown, 12 images]
[im 3/37  brain]
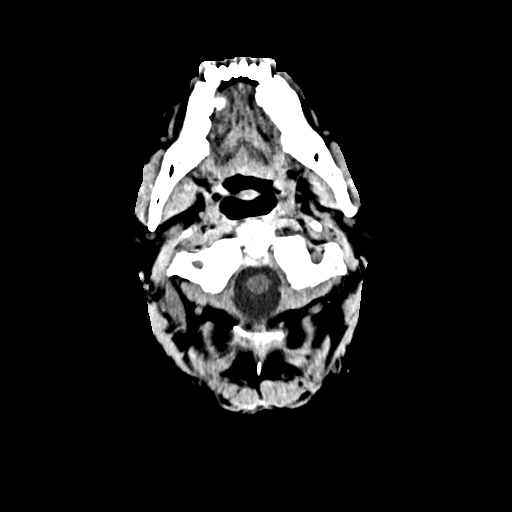
[im 3/37  bone]
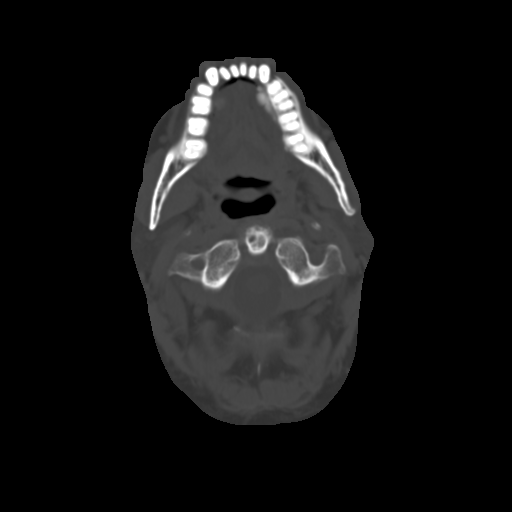
[im 8/37  brain]
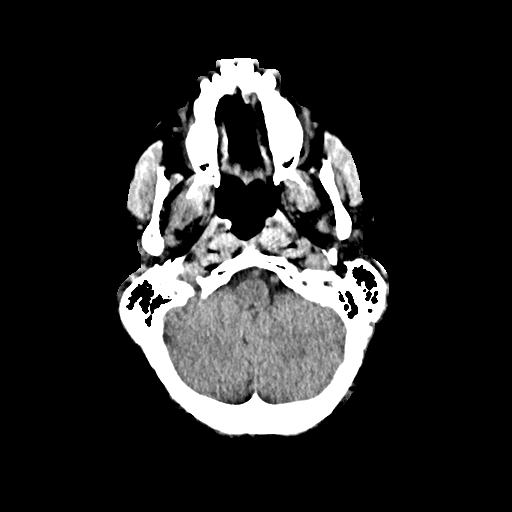
[im 11/37  brain]
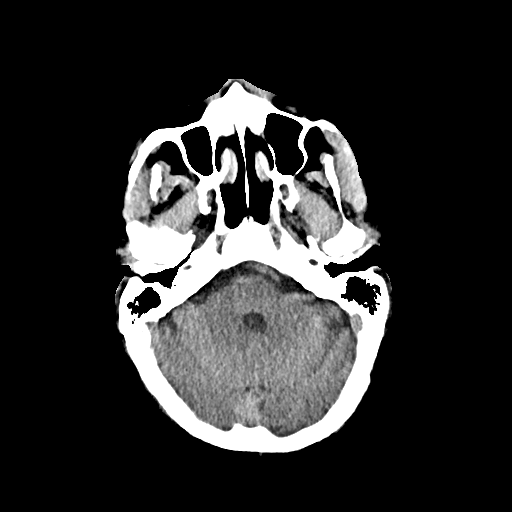
[im 16/37  brain]
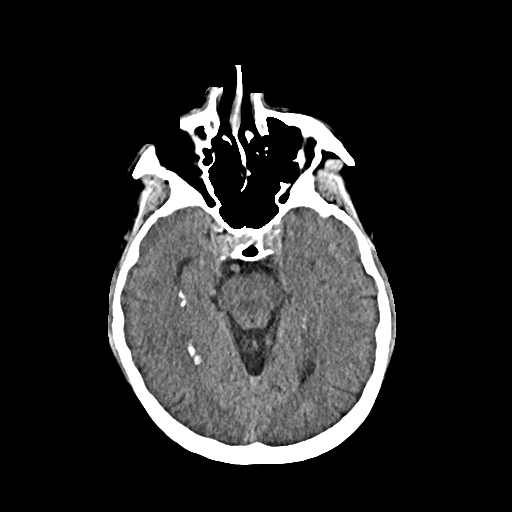
[im 19/37  brain]
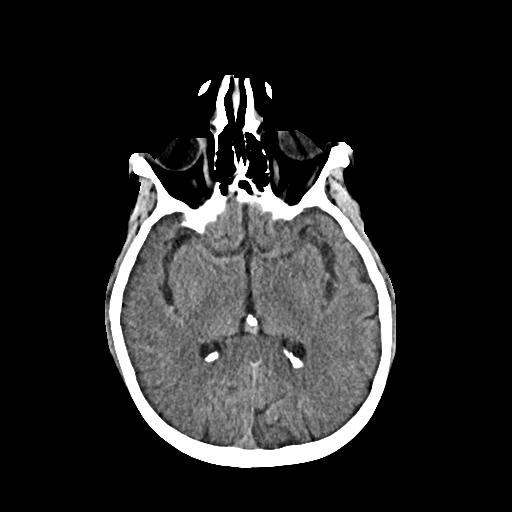
[im 19/37  bone]
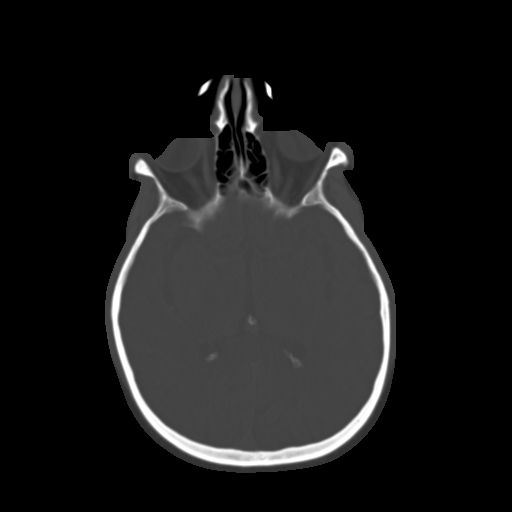
[im 21/37  brain]
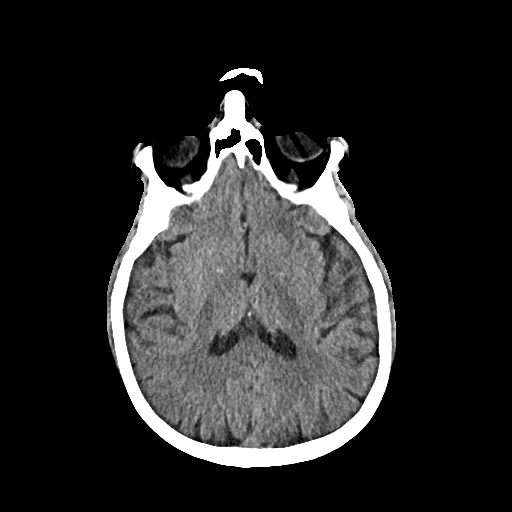
[im 26/37  brain]
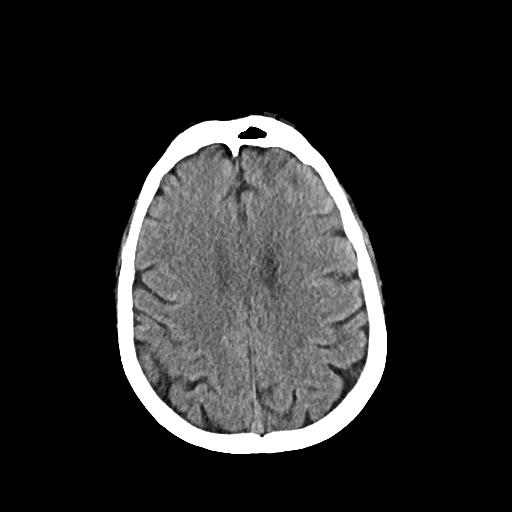
[im 29/37  brain]
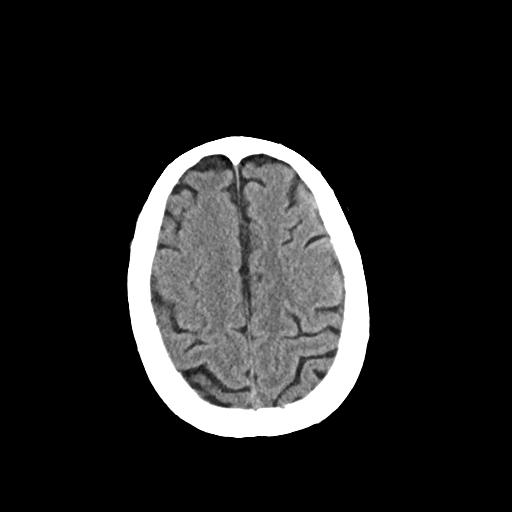
[im 34/37  brain]
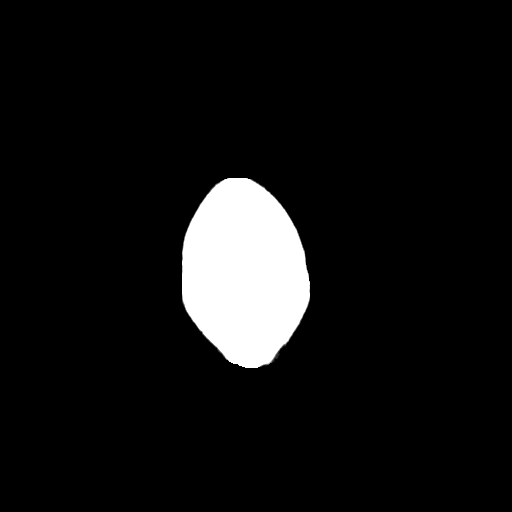
[im 34/37  bone]
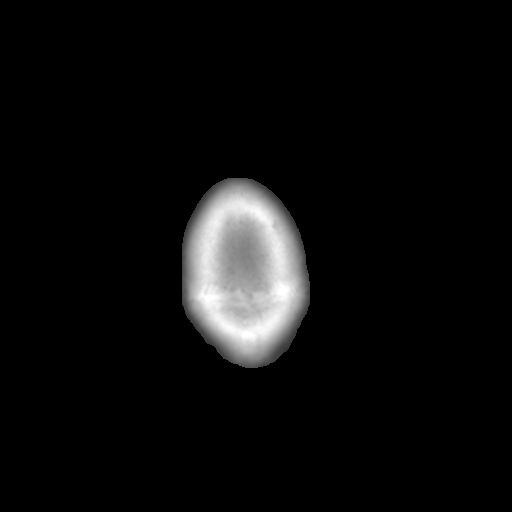

[Series 4: head 3.00 hr40 s3 sag · sagittal · 0.37mm/px · 3 of 86 slices shown]
[im 29/86  brain]
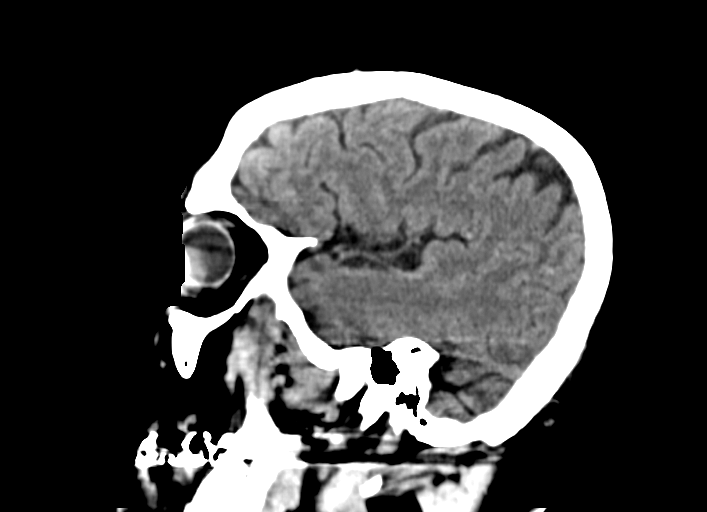
[im 43/86  brain]
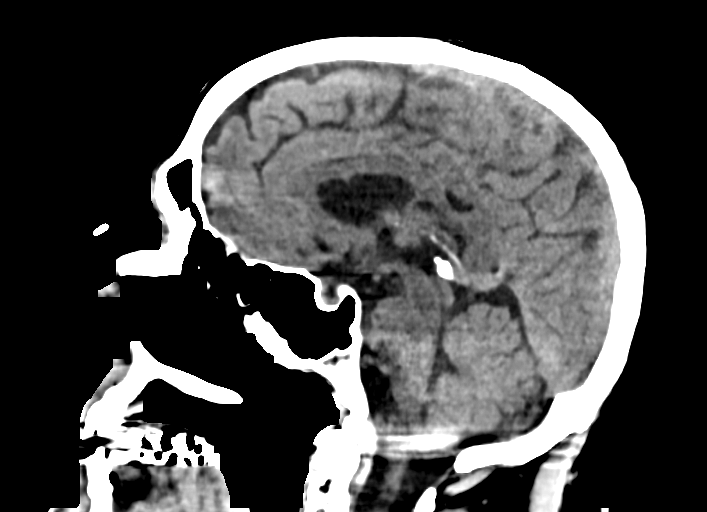
[im 57/86  brain]
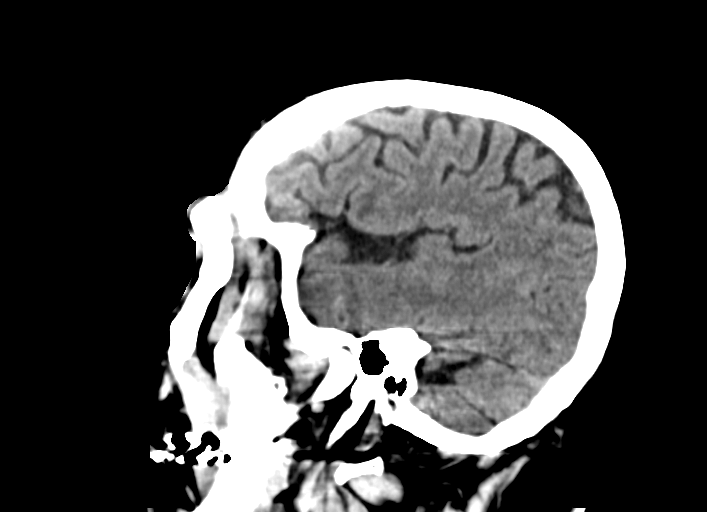

[Series 6: head 3.00 hr40 s3 cor · coronal · 0.37mm/px · 3 of 83 slices shown]
[im 28/83  brain]
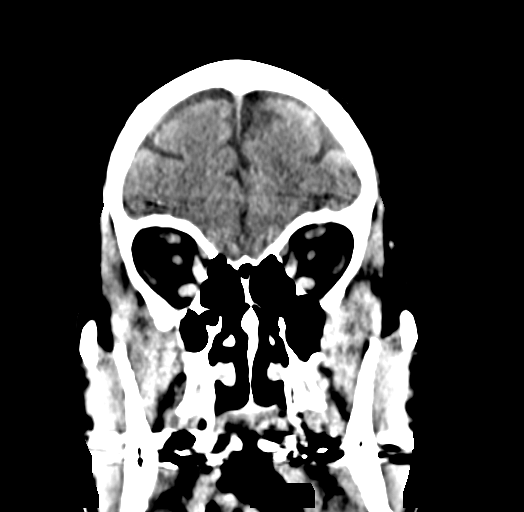
[im 37/83  brain]
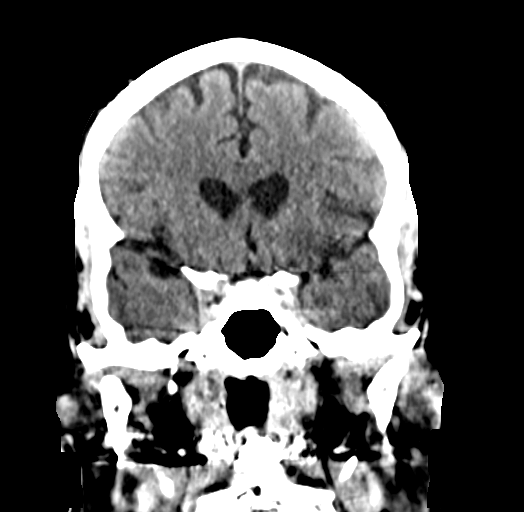
[im 46/83  brain]
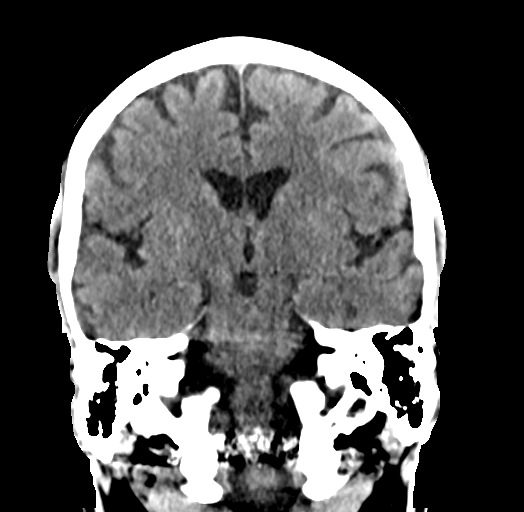

[15 of 47 positions shown; findings below may reference images not displayed]

FINDINGS: Brain: Streak artifact from dental hardware partially obscures
posterior fossa evaluation. Brain volume is normal for age. No
intracranial hemorrhage, mass effect, or midline shift. No evidence
of intracranial mass. No hydrocephalus. The basilar cisterns are
patent. No evidence of territorial infarct or acute ischemia. No
extra-axial or intracranial fluid collection.

Vascular: No hyperdense vessel.

Skull: No fracture or focal lesion.

Sinuses/Orbits: Paranasal sinuses and mastoid air cells are clear.
The visualized orbits are unremarkable.

Other: None.
IMPRESSION: Unremarkable noncontrast head CT. No acute findings or explanation
for dizziness.

## 2022-01-10 ENCOUNTER — Other Ambulatory Visit: Payer: Self-pay | Admitting: Family Medicine

## 2022-01-10 DIAGNOSIS — Z87891 Personal history of nicotine dependence: Secondary | ICD-10-CM

## 2022-01-10 DIAGNOSIS — Z122 Encounter for screening for malignant neoplasm of respiratory organs: Secondary | ICD-10-CM

## 2022-01-11 ENCOUNTER — Other Ambulatory Visit: Payer: Self-pay | Admitting: Family Medicine

## 2022-01-11 ENCOUNTER — Ambulatory Visit
Admission: RE | Admit: 2022-01-11 | Discharge: 2022-01-11 | Disposition: A | Discharge status: Home / Self Care | Payer: Medicare Other | Source: Ambulatory Visit | Attending: Family Medicine | Admitting: Family Medicine

## 2022-01-11 DIAGNOSIS — M25512 Pain in left shoulder: Secondary | ICD-10-CM

## 2022-01-16 ENCOUNTER — Ambulatory Visit
Admission: RE | Admit: 2022-01-16 | Discharge: 2022-01-16 | Disposition: A | Discharge status: Home / Self Care | Payer: Medicare Other | Source: Ambulatory Visit | Attending: Family Medicine | Admitting: Family Medicine

## 2022-01-16 DIAGNOSIS — Z87891 Personal history of nicotine dependence: Secondary | ICD-10-CM

## 2022-01-16 DIAGNOSIS — Z122 Encounter for screening for malignant neoplasm of respiratory organs: Secondary | ICD-10-CM

## 2022-04-11 ENCOUNTER — Other Ambulatory Visit: Payer: Self-pay | Admitting: Student in an Organized Health Care Education/Training Program

## 2022-04-11 DIAGNOSIS — R918 Other nonspecific abnormal finding of lung field: Secondary | ICD-10-CM

## 2022-04-11 DIAGNOSIS — Z87891 Personal history of nicotine dependence: Secondary | ICD-10-CM

## 2022-05-10 ENCOUNTER — Ambulatory Visit
Admission: RE | Admit: 2022-05-10 | Discharge: 2022-05-10 | Disposition: A | Discharge status: Home / Self Care | Payer: Medicare Other | Source: Ambulatory Visit | Attending: Student in an Organized Health Care Education/Training Program | Admitting: Student in an Organized Health Care Education/Training Program

## 2022-05-10 DIAGNOSIS — Z87891 Personal history of nicotine dependence: Secondary | ICD-10-CM

## 2022-05-10 DIAGNOSIS — R918 Other nonspecific abnormal finding of lung field: Secondary | ICD-10-CM

## 2022-09-05 ENCOUNTER — Encounter: Payer: Self-pay | Admitting: Internal Medicine

## 2022-09-25 ENCOUNTER — Ambulatory Visit (AMBULATORY_SURGERY_CENTER): Payer: Medicare Other

## 2022-09-25 ENCOUNTER — Encounter: Payer: Self-pay | Admitting: Internal Medicine

## 2022-09-25 VITALS — Ht 75.0 in | Wt 210.0 lb

## 2022-09-25 DIAGNOSIS — Z86010 Personal history of colonic polyps: Secondary | ICD-10-CM

## 2022-09-25 MED ORDER — SUPREP BOWEL PREP KIT 17.5-3.13-1.6 GM/177ML PO SOLN: ORAL | 0 refills | Status: DC

## 2022-09-25 NOTE — Progress Notes (Signed)

## 2022-10-11 ENCOUNTER — Encounter: Payer: Self-pay | Admitting: Internal Medicine

## 2022-10-15 ENCOUNTER — Encounter: Payer: Self-pay | Admitting: Internal Medicine

## 2022-10-15 ENCOUNTER — Ambulatory Visit (AMBULATORY_SURGERY_CENTER): Payer: Medicare HMO | Admitting: Internal Medicine

## 2022-10-15 VITALS — BP 131/89 | HR 77 | Temp 98.2°F | Resp 16 | Ht 75.0 in | Wt 210.0 lb

## 2022-10-15 DIAGNOSIS — D127 Benign neoplasm of rectosigmoid junction: Secondary | ICD-10-CM

## 2022-10-15 DIAGNOSIS — Z86010 Personal history of colonic polyps: Secondary | ICD-10-CM | POA: Diagnosis not present

## 2022-10-15 DIAGNOSIS — Z09 Encounter for follow-up examination after completed treatment for conditions other than malignant neoplasm: Secondary | ICD-10-CM | POA: Diagnosis present

## 2022-10-15 DIAGNOSIS — D123 Benign neoplasm of transverse colon: Secondary | ICD-10-CM

## 2022-10-15 MED ORDER — SODIUM CHLORIDE 0.9 % IV SOLN: 500.0000 mL | Freq: Once | INTRAVENOUS | Status: DC

## 2022-10-15 NOTE — Progress Notes (Signed)
Pt's states no medical or surgical changes since previsit or office visit. CW, NT obtained patient VS.

## 2022-10-15 NOTE — Progress Notes (Signed)
GASTROENTEROLOGY PROCEDURE H&P NOTE   Primary Care Physician: Margretta Sidle, MD    Reason for Procedure:  History of multiple adenomatous colon polyps including those greater than a centimeter  Plan:    Colonoscopy  Patient is appropriate for endoscopic procedure(s) in the ambulatory ([facility]) setting.  The nature of the procedure, as well as the risks, benefits, and alternatives were carefully and thoroughly reviewed with the patient. Ample time for discussion and questions allowed. The patient understood, was satisfied, and agreed to proceed.     HPI: Gordon Jacobs is a 72 y.o. male who presents for surveillance colonoscopy.  Medical history as below.  Tolerated the prep.  No recent chest pain or shortness of breath.  No abdominal pain today.  Past Medical History:  Diagnosis Date   Borderline hyperlipidemia    Cancer ([place])    Frequent headaches    Peptic ulcer    Vertigo     Past Surgical History:  Procedure Laterality Date   INGUINAL HERNIA REPAIR      Prior to Admission medications   Medication Sig Start Date End Date Taking? Authorizing Provider  folic acid (FOLVITE) 1 MG tablet Take 1 mg by mouth daily.   Yes [provider]  Bazine KIT 17.5-3.13-1.6 GM/177ML SOLN Suprep-Use as directed 09/25/22  Yes Josalin Carneiro, Lajuan Lines, MD  vitamin B-12 (CYANOCOBALAMIN) 100 MCG tablet Take 100 mcg by mouth daily.   Yes [provider]  atorvastatin (LIPITOR) 80 MG tablet Take 80 mg by mouth daily. Patient not taking: Reported on 10/15/2022 07/10/22   [provider]  cetirizine (ZYRTEC) 10 MG tablet Take 10 mg by mouth daily.    [provider]  omeprazole (PRILOSEC) 40 MG capsule Take 1 capsule (40 mg total) by mouth 2 (two) times daily for 14 days. 09/28/19 10/12/19  Takeo Harts, Lajuan Lines, MD  pantoprazole (PROTONIX) 40 MG tablet TAKE 1 TABLET(40 MG) BY MOUTH DAILY Patient not taking: Reported on 10/15/2022 10/14/19   Jerene Bears, MD    Current  Outpatient Medications  Medication Sig Dispense Refill   folic acid (FOLVITE) 1 MG tablet Take 1 mg by mouth daily.     SUPREP BOWEL PREP KIT 17.5-3.13-1.6 GM/177ML SOLN Suprep-Use as directed 354 mL 0   vitamin B-12 (CYANOCOBALAMIN) 100 MCG tablet Take 100 mcg by mouth daily.     atorvastatin (LIPITOR) 80 MG tablet Take 80 mg by mouth daily. (Patient not taking: Reported on 10/15/2022)     cetirizine (ZYRTEC) 10 MG tablet Take 10 mg by mouth daily.     omeprazole (PRILOSEC) 40 MG capsule Take 1 capsule (40 mg total) by mouth 2 (two) times daily for 14 days. 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    Family History  Problem Relation Age of Onset   Colon polyps Mother    Stomach cancer Mother    Liver cancer Mother    Ovarian cancer Mother    Breast cancer Sister    Colon polyps Brother    Colon cancer Neg Hx  Esophageal cancer Neg Hx    Rectal cancer Neg Hx     Social History   Socioeconomic History   Marital status: Married    Spouse name: Not on file   Number of children: Not on file   Years of education: Not on file   Highest education level: Not on file  Occupational History   Not on file  Tobacco Use   Smoking status: Never   Smokeless tobacco: Never  Vaping Use   Vaping Use: Never used  Substance and Sexual Activity   Alcohol use: Yes    Comment: at most, 5  beers in a week   Drug use: Never   Sexual activity: Not on file  Other Topics Concern   Not on file  Social History Narrative   Not on file   Social Determinants of Health    Financial Resource Strain: Not on file  Food Insecurity: Not on file  Transportation Needs: Not on file  Physical Activity: Not on file  Stress: Not on file  Social Connections: Not on file  Intimate Partner Violence: Not on file    Physical Exam: Vital signs in last 24 hours: '@BP'$  (!) 132/97   Pulse 81   Temp 98.2 F (36.8 C) (Skin)   Resp 15   Ht '6\' 3"'$  (1.905 m)   Wt 210 lb (95.3 kg)   SpO2 97%   BMI 26.25 kg/m  GEN: NAD EYE: Sclerae anicteric ENT: MMM CV: Non-tachycardic Pulm: CTA b/l GI: Soft, NT/ND NEURO:  Alert & Oriented x 3   Zenovia Jarred, MD [facility]  10/15/2022 9:40 AM

## 2022-10-15 NOTE — Op Note (Signed)
[facility] Patient Name: Gordon Jacobs Procedure Date: 10/15/2022 9:36 AM MRN: [id number] Endoscopist: Jerene Bears , MD, [id number] Age: 72 Referring MD:  Date of Birth: Feb 17, 1951 Gender: Male Account #: [id number] Procedure:                Colonoscopy Indications:              High risk colon cancer surveillance: Personal                            history of multiple (3 or more) adenomas, Last                            colonoscopy: December 2020 Medicines:                Monitored Anesthesia Care Procedure:                Pre-Anesthesia Assessment:                           - Prior to the procedure, a History and Physical                            was performed, and patient medications and                            allergies were reviewed. The patient's tolerance of                            previous anesthesia was also reviewed. The risks                            and benefits of the procedure and the sedation                            options and risks were discussed with the patient.                            All questions were answered, and informed consent                            was obtained. Prior Anticoagulants: The patient has                            taken no anticoagulant or antiplatelet agents. ASA                            Grade Assessment: II - A patient with mild systemic                            disease. After reviewing the risks and benefits,                            the patient was deemed in satisfactory condition to  undergo the procedure.                           After obtaining informed consent, the colonoscope                            was passed under direct vision. Throughout the                            procedure, the patient's blood pressure, pulse, and                            oxygen saturations were monitored continuously. The                            CF [id number] #[id number] was introduced  through the anus                            and advanced to the cecum, identified by                            appendiceal orifice and ileocecal valve. The                            colonoscopy was performed without difficulty. The                            patient tolerated the procedure well. The quality                            of the bowel preparation was adequate. The                            ileocecal valve, appendiceal orifice, and rectum                            were photographed. Scope In: 9:45:57 AM Scope Out: 10:05:44 AM Scope Withdrawal Time: 0 hours 15 minutes 26 seconds  Total Procedure Duration: 0 hours 19 minutes 47 seconds  Findings:                 The digital rectal exam was normal.                           Six sessile polyps were found in the transverse                            colon. The polyps were 3 to 6 mm in size. These                            polyps were removed with a cold snare. Resection                            and retrieval were complete.  A 4 mm polyp was found in the recto-sigmoid colon.                            The polyp was sessile. The polyp was removed with a                            cold snare. Resection and retrieval were complete.                           Many large-mouthed, medium-mouthed and                            small-mouthed diverticula were found in the sigmoid                            colon and descending colon.                           Internal hemorrhoids were found during                            retroflexion. The hemorrhoids were medium-sized. Complications:            No immediate complications. Estimated Blood Loss:     Estimated blood loss was minimal. Impression:               - Six 3 to 6 mm polyps in the transverse colon,                            removed with a cold snare. Resected and retrieved.                           - One 4 mm polyp at the recto-sigmoid colon,                             removed with a cold snare. Resected and retrieved.                           - Severe diverticulosis in the sigmoid colon and in                            the descending colon.                           - Internal hemorrhoids. Recommendation:           - Patient has a contact number available for                            emergencies. The signs and symptoms of potential                            delayed complications were discussed with the  patient. Return to normal activities tomorrow.                            Written discharge instructions were provided to the                            patient.                           - Resume previous diet.                           - Continue present medications.                           - Await pathology results.                           - Repeat colonoscopy is recommended for                            surveillance. The colonoscopy date will be                            determined after pathology results from today's                            exam become available for review. Jerene Bears, MD 10/15/2022 10:14:46 AM This report has been signed electronically.

## 2022-10-15 NOTE — Patient Instructions (Signed)

## 2022-10-15 NOTE — Progress Notes (Signed)
Vss nad trans to pacu °

## 2022-10-16 ENCOUNTER — Telehealth: Payer: Self-pay | Admitting: *Deleted

## 2022-10-16 NOTE — Telephone Encounter (Signed)
  Follow up Call-     10/15/2022    9:00 AM  Call back number  Post procedure Call Back phone  # [phone]  Permission to leave phone message Yes     Patient questions:  Do you have a fever, pain , or abdominal swelling? No. Pain Score  0 *  Have you tolerated food without any problems? Yes.    Have you been able to return to your normal activities? Yes.    Do you have any questions about your discharge instructions: Diet   No. Medications  No. Follow up visit  No.  Do you have questions or concerns about your Care? No.  Actions: * If pain score is 4 or above: No action needed, pain <4.

## 2022-10-17 ENCOUNTER — Encounter: Payer: Self-pay | Admitting: Internal Medicine

## 2023-04-12 ENCOUNTER — Other Ambulatory Visit: Payer: Self-pay | Admitting: Adult Health

## 2023-04-12 DIAGNOSIS — Z8701 Personal history of pneumonia (recurrent): Secondary | ICD-10-CM

## 2023-04-12 DIAGNOSIS — F17200 Nicotine dependence, unspecified, uncomplicated: Secondary | ICD-10-CM

## 2023-05-06 ENCOUNTER — Encounter: Payer: Self-pay | Admitting: Adult Health

## 2023-05-09 ENCOUNTER — Ambulatory Visit
Admission: RE | Admit: 2023-05-09 | Discharge: 2023-05-09 | Disposition: A | Discharge status: Home / Self Care | Payer: Medicare HMO | Source: Ambulatory Visit | Attending: Adult Health | Admitting: Adult Health

## 2023-05-09 DIAGNOSIS — F17200 Nicotine dependence, unspecified, uncomplicated: Secondary | ICD-10-CM

## 2023-05-09 DIAGNOSIS — Z8701 Personal history of pneumonia (recurrent): Secondary | ICD-10-CM

## 2023-12-20 ENCOUNTER — Other Ambulatory Visit: Payer: Self-pay | Admitting: Adult Health

## 2023-12-20 DIAGNOSIS — Z122 Encounter for screening for malignant neoplasm of respiratory organs: Secondary | ICD-10-CM

## 2023-12-20 DIAGNOSIS — Z87891 Personal history of nicotine dependence: Secondary | ICD-10-CM

## 2024-05-04 ENCOUNTER — Encounter: Payer: Self-pay | Admitting: Adult Health

## 2024-05-08 ENCOUNTER — Inpatient Hospital Stay (HOSPITAL_COMMUNITY)
Admission: EM | Admit: 2024-05-08 | Discharge: 2024-05-10 | DRG: 064 | Disposition: A | Discharge status: Home / Self Care | Source: Ambulatory Visit | Attending: Internal Medicine | Admitting: Internal Medicine

## 2024-05-08 ENCOUNTER — Emergency Department (HOSPITAL_COMMUNITY)

## 2024-05-08 ENCOUNTER — Other Ambulatory Visit: Payer: Self-pay

## 2024-05-08 ENCOUNTER — Encounter (HOSPITAL_COMMUNITY): Payer: Self-pay

## 2024-05-08 DIAGNOSIS — K219 Gastro-esophageal reflux disease without esophagitis: Secondary | ICD-10-CM | POA: Diagnosis present

## 2024-05-08 DIAGNOSIS — E7849 Other hyperlipidemia: Secondary | ICD-10-CM

## 2024-05-08 DIAGNOSIS — Z800 Family history of malignant neoplasm of digestive organs: Secondary | ICD-10-CM

## 2024-05-08 DIAGNOSIS — H543 Unqualified visual loss, both eyes: Secondary | ICD-10-CM | POA: Diagnosis present

## 2024-05-08 DIAGNOSIS — H538 Other visual disturbances: Secondary | ICD-10-CM | POA: Insufficient documentation

## 2024-05-08 DIAGNOSIS — Z87892 Personal history of anaphylaxis: Secondary | ICD-10-CM | POA: Diagnosis not present

## 2024-05-08 DIAGNOSIS — Z83719 Family history of colon polyps, unspecified: Secondary | ICD-10-CM | POA: Diagnosis not present

## 2024-05-08 DIAGNOSIS — I618 Other nontraumatic intracerebral hemorrhage: Secondary | ICD-10-CM | POA: Diagnosis present

## 2024-05-08 DIAGNOSIS — I6389 Other cerebral infarction: Secondary | ICD-10-CM | POA: Diagnosis not present

## 2024-05-08 DIAGNOSIS — I63531 Cerebral infarction due to unspecified occlusion or stenosis of right posterior cerebral artery: Secondary | ICD-10-CM | POA: Diagnosis present

## 2024-05-08 DIAGNOSIS — I639 Cerebral infarction, unspecified: Secondary | ICD-10-CM | POA: Diagnosis not present

## 2024-05-08 DIAGNOSIS — I161 Hypertensive emergency: Secondary | ICD-10-CM | POA: Diagnosis present

## 2024-05-08 DIAGNOSIS — R42 Dizziness and giddiness: Secondary | ICD-10-CM | POA: Diagnosis not present

## 2024-05-08 DIAGNOSIS — I619 Nontraumatic intracerebral hemorrhage, unspecified: Secondary | ICD-10-CM

## 2024-05-08 DIAGNOSIS — R29702 NIHSS score 2: Secondary | ICD-10-CM | POA: Diagnosis present

## 2024-05-08 DIAGNOSIS — Z8711 Personal history of peptic ulcer disease: Secondary | ICD-10-CM | POA: Diagnosis not present

## 2024-05-08 DIAGNOSIS — Z8041 Family history of malignant neoplasm of ovary: Secondary | ICD-10-CM | POA: Diagnosis not present

## 2024-05-08 DIAGNOSIS — Z91018 Allergy to other foods: Secondary | ICD-10-CM | POA: Diagnosis not present

## 2024-05-08 DIAGNOSIS — Z803 Family history of malignant neoplasm of breast: Secondary | ICD-10-CM | POA: Diagnosis not present

## 2024-05-08 DIAGNOSIS — E785 Hyperlipidemia, unspecified: Secondary | ICD-10-CM | POA: Diagnosis present

## 2024-05-08 DIAGNOSIS — Z79899 Other long term (current) drug therapy: Secondary | ICD-10-CM | POA: Diagnosis not present

## 2024-05-08 DIAGNOSIS — I10 Essential (primary) hypertension: Secondary | ICD-10-CM | POA: Diagnosis present

## 2024-05-08 DIAGNOSIS — G936 Cerebral edema: Secondary | ICD-10-CM | POA: Diagnosis not present

## 2024-05-08 DIAGNOSIS — C050 Malignant neoplasm of hard palate: Secondary | ICD-10-CM

## 2024-05-08 LAB — CBC
HCT: 42.9 % (ref 39.0–52.0)
Hemoglobin: 14.1 g/dL (ref 13.0–17.0)
MCH: 29.1 pg (ref 26.0–34.0)
MCHC: 32.9 g/dL (ref 30.0–36.0)
MCV: 88.5 fL (ref 80.0–100.0)
Platelets: 216 K/uL (ref 150–400)
RBC: 4.85 MIL/uL (ref 4.22–5.81)
RDW: 13.1 % (ref 11.5–15.5)
WBC: 9.1 K/uL (ref 4.0–10.5)
nRBC: 0 % (ref 0.0–0.2)

## 2024-05-08 LAB — COMPREHENSIVE METABOLIC PANEL WITH GFR
ALT: 17 U/L (ref 0–44)
AST: 20 U/L (ref 15–41)
Albumin: 3.8 g/dL (ref 3.5–5.0)
Alkaline Phosphatase: 72 U/L (ref 38–126)
Anion gap: 12 (ref 5–15)
BUN: 14 mg/dL (ref 8–23)
CO2: 19 mmol/L — ABNORMAL LOW (ref 22–32)
Calcium: 8.6 mg/dL — ABNORMAL LOW (ref 8.9–10.3)
Chloride: 107 mmol/L (ref 98–111)
Creatinine, Ser: 0.92 mg/dL (ref 0.61–1.24)
GFR, Estimated: >60 mL/min (ref >60)
Glucose, Bld: 92 mg/dL (ref 70–99)
Potassium: 3.6 mmol/L (ref 3.5–5.1)
Sodium: 138 mmol/L (ref 135–145)
Total Bilirubin: 1.6 mg/dL — ABNORMAL HIGH (ref 0.0–1.2)
Total Protein: 6.2 g/dL — ABNORMAL LOW (ref 6.5–8.1)

## 2024-05-08 LAB — BASIC METABOLIC PANEL WITH GFR
Anion gap: 11 (ref 5–15)
BUN: 17 mg/dL (ref 8–23)
CO2: 20 mmol/L — ABNORMAL LOW (ref 22–32)
Calcium: 8.6 mg/dL — ABNORMAL LOW (ref 8.9–10.3)
Chloride: 105 mmol/L (ref 98–111)
Creatinine, Ser: 0.94 mg/dL (ref 0.61–1.24)
GFR, Estimated: >60 mL/min (ref >60)
Glucose, Bld: 104 mg/dL — ABNORMAL HIGH (ref 70–99)
Potassium: 4 mmol/L (ref 3.5–5.1)
Sodium: 136 mmol/L (ref 135–145)

## 2024-05-08 MED ORDER — STROKE: EARLY STAGES OF RECOVERY BOOK
Freq: Once | Status: AC
  Administered 2024-05-09: 1
  Filled 2024-05-08: qty 1

## 2024-05-08 MED ORDER — LABETALOL HCL 5 MG/ML IV SOLN
10.0000 mg | INTRAVENOUS | Status: DC | PRN
  Administered 2024-05-08: 10 mg via INTRAVENOUS
  Filled 2024-05-08: qty 4

## 2024-05-08 MED ORDER — ACETAMINOPHEN 650 MG RE SUPP: 650.0000 mg | RECTAL | Status: DC | PRN

## 2024-05-08 MED ORDER — ACETAMINOPHEN 325 MG PO TABS: 650.0000 mg | ORAL_TABLET | ORAL | Status: DC | PRN

## 2024-05-08 MED ORDER — MECLIZINE HCL 25 MG PO TABS: 12.5000 mg | ORAL_TABLET | Freq: Three times a day (TID) | ORAL | Status: DC | PRN

## 2024-05-08 MED ORDER — ACETAMINOPHEN 160 MG/5ML PO SOLN: 650.0000 mg | ORAL | Status: DC | PRN

## 2024-05-08 MED ORDER — SODIUM CHLORIDE 0.9 % IV SOLN: INTRAVENOUS | Status: AC

## 2024-05-08 MED ORDER — PANTOPRAZOLE SODIUM 40 MG PO TBEC
40.0000 mg | DELAYED_RELEASE_TABLET | Freq: Every day | ORAL | Status: DC
  Administered 2024-05-09 – 2024-05-10 (×2): 40 mg via ORAL
  Filled 2024-05-08 (×2): qty 1

## 2024-05-08 MED ORDER — AMLODIPINE BESYLATE 5 MG PO TABS: 10.0000 mg | ORAL_TABLET | Freq: Every day | ORAL | Status: DC

## 2024-05-08 MED ORDER — AMLODIPINE BESYLATE 5 MG PO TABS: 5.0000 mg | ORAL_TABLET | Freq: Every day | ORAL | Status: DC

## 2024-05-08 MED ORDER — AMLODIPINE BESYLATE 5 MG PO TABS
10.0000 mg | ORAL_TABLET | Freq: Every day | ORAL | Status: DC
  Administered 2024-05-08 – 2024-05-10 (×3): 10 mg via ORAL
  Filled 2024-05-08 (×3): qty 2

## 2024-05-08 MED ORDER — ATORVASTATIN CALCIUM 80 MG PO TABS
80.0000 mg | ORAL_TABLET | Freq: Every day | ORAL | Status: DC
  Administered 2024-05-09 – 2024-05-10 (×2): 80 mg via ORAL
  Filled 2024-05-08 (×2): qty 1

## 2024-05-08 MED ORDER — SENNOSIDES-DOCUSATE SODIUM 8.6-50 MG PO TABS: 1.0000 | ORAL_TABLET | Freq: Every evening | ORAL | Status: DC | PRN

## 2024-05-08 MED ORDER — LABETALOL HCL 5 MG/ML IV SOLN: 10.0000 mg | INTRAVENOUS | Status: DC | PRN

## 2024-05-08 NOTE — ED Provider Notes (Signed)
 [facility] EMERGENC[facility] DEPARTMENT AT [facility] Provider Note  MDM   HPI/ROS:  Gordon Jacobs is a 73 y.o. male with a medical history as below who presents with bilateral peripheral vision loss/blurriness that started Thursday morning.  Patient states that he went to bed Wednesday night in his normal health but had these issues when he woke up Thursday.  DOST could not get into see an ophthalmologist was this morning who referred him over here after a normal ophthalmologic assessment.  He denies any infectious symptoms, headaches.  He he states his vision is otherwise normal when he is looking straight forward however he has a significant blurriness and haziness in his periphery on the left worse than the right  Physical exam is notable for: - Bilateral temporal field deficits.  Otherwise benign neurologic exam  On my initial evaluation, patient is:  -Vital signs stable. Patient afebrile, hemodynamically stable, and non-toxic appearing.   This patient's current presentation, including their history and physical exam, is most consistent with CVA. Differentials include CVA, meningitis, malignancy.    Physical exam reassuring as patient is hemodynamically intact with some hypertension to the 160s systolic.  He is afebrile and has no other signs or symptoms of infection.  His physical exam as above is concerning and MRI was completed on his arrival which shows as below.  Given this concern for acute ischemic stroke will consult neurology.  I do believe he is outside the window for any acute intervention.  See neurology note for details however in brief they recommend admission to the hospitalist service for full stroke workup.  Also recommend blood pressure goal less than 160 systolic thus labetalol was ordered.  Spoke with the hospitalist team and will admit this patient for further workup and treatment.  He was admitted in stable condition  Interpretations, interventions, and the  patient's course of care are documented below.    Clinical Course as of 05/08/24 2015  Fri May 08, 2024  1834 BP(!): 161/96 Will hold off on antihypertensives right now given ischemic stroke, allow for permissive hypertension.  Awaiting neurology callback given hemorrhagic conversion [RC]  1902 CT Head Wo Contrast Concern for hemorrhagic conversion [RC]  1902 MR BRAIN WO CONTRAST [RC]  1902 MR BRAIN WO CONTRAST Right PCA infarct [RC]  1902 MR ANGIO HEAD WO CONTRAST No LVO [RC]  1902 Cmp with trace nonanion gap acidosis and glucose to 104 otherwise no gross electrolyte derangements and renal function is appropriate [RC]  1903 CBC without leukocytosis or anemia [RC]    Clinical Course User Index [RC] Sharyne Darina RAMAN, MD      Disposition:  I discussed the case with hospitalist who graciously agreed to admit the patient to their service for continued care.   Clinical Impression:  1. Ischemic stroke (HCC)   2. Acute ischemic right PCA stroke [facility])     The plan for this patient was discussed with Dr. Ula, who voiced agreement and who oversaw evaluation and treatment of this patient.   Clinical Complexity A medically appropriate history, review of systems, and physical exam was performed.  My independent interpretations of EKG, labs, and radiology are documented in the ED course above.   If decision rules were used in this patient's evaluation, they are listed below.   Click here for ABCD2, HEART and other calculatorsREFRESH Note before signing   Patient's presentation is most consistent with acute presentation with potential threat to life or bodily function.  Medical Decision Making Amount and/or  Complexity of Data Reviewed Radiology:  Decision-making details documented in ED Course.  Risk Decision regarding hospitalization.    HPI/ROS      See MDM section for pertinent HPI and ROS. A complete ROS was performed with pertinent positives/negatives noted above.    Past Medical History:  Diagnosis Date   Borderline hyperlipidemia    Cancer (HCC)    Frequent headaches    Peptic ulcer    Vertigo     Past Surgical History:  Procedure Laterality Date   INGUINAL HERNIA REPAIR        Physical Exam   Vitals:   05/08/24 1815 05/08/24 1915 05/08/24 1945 05/08/24 2000  BP: (!) 161/96 (!) 167/101 (!) 173/102 (!) 176/96  Pulse: 75 67 70 68  Resp: (!) 8 (!) 8 17 18   Temp:      TempSrc:      SpO2: 100% 100% 100% 100%  Weight:      Height:        Physical Exam Vitals and nursing note reviewed.  Constitutional:      General: He is not in acute distress.    Appearance: He is well-developed.  HENT:     Head: Normocephalic and atraumatic.  Eyes:     Conjunctiva/sclera: Conjunctivae normal.     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 DM in the lower temporal quadrant.     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 ABS in the lower nasal quadrant.  Cardiovascular:     Rate and Rhythm: Normal rate and regular rhythm.     Heart sounds: No murmur heard. Pulmonary:     Effort: Pulmonary effort is normal. No respiratory distress.     Breath sounds: Normal breath sounds.  Abdominal:     Palpations: Abdomen is soft.     Tenderness: There is no abdominal tenderness.  Musculoskeletal:        General: No swelling.     Cervical back: Neck supple.  Skin:    General: Skin is warm and dry.     Capillary Refill: Capillary refill takes less than 2 seconds.  Neurological:     Mental Status: He is alert and oriented to person, place, and time. Mental status is at baseline.     GCS: GCS eye subscore is 4. GCS verbal subscore is 5. GCS motor subscore is 6.     Cranial Nerves: Cranial nerves 2-12 are intact.     Sensory: Sensation is intact.     Motor: Motor function is intact.     Coordination: Coordination is intact.     Gait: Gait is intact.  Psychiatric:        Mood and Affect: Mood normal.      Procedures   If  procedures were preformed on this patient, they are listed below:  Procedures   @BBSIG @   Please note that this documentation was produced with the assistance of voice-to-text technology and may contain errors.    Sharyne Darina RAMAN, MD 05/08/24 2017    Ula Prentice SAUNDERS, MD 05/08/24 [phone]

## 2024-05-08 NOTE — ED Notes (Addendum)
 Pt taken upstairs. RN up there aware that he's had IV BP meds and will re assess upstairs.

## 2024-05-08 NOTE — ED Triage Notes (Signed)
 Pt to ED via POV from eye center c/o vision problem. Last known well Wednesday night when going to bed. Reports woke up yesterday morning at 8am with decrease peripheral vision, reports has not resolved.

## 2024-05-08 NOTE — H&P (Addendum)
 History and Physical    Gordon Jacobs FMW:[id number] DOB: 1951/04/24 DOA: 05/08/2024  PCP: Jess Llano, MD   Patient coming from: Home   Chief Complaint:  Chief Complaint  Patient presents with   Loss of Vision   Tunnel Vission   Flashing Light Lt eye   Decreased Peripheral Vision   Headache   ED TRIAGE note:Pt to ED via POV from eye center c/o vision problem. Last known well Wednesday night when going to bed. Reports woke up yesterday morning at 8am with decrease peripheral vision, reports has not resolved.   HPI:  Gordon Jacobs is a 73 y.o. male with medical history significant of chronic vertigo, seborrheic keratosis, GERD, aortic atherosclerosis, essential hypertension, inguinal hernia, and hyperlipidemia has been referred from [facility] to emergency department for complaining of bilateral peripheral vision loss/blurriness that started Thursday morning 7/31. Patient states that he went to bed Wednesday night in his normal health but had these issues when he woke up Thursday. DOST could not get into see an ophthalmologist was this morning who referred him over here after a normal ophthalmologic assessment. He he states his vision is otherwise normal when he is looking straight forward however he has a significant blurriness and haziness in his periphery on the left worse than the right. Patient is complaining about headache of the left occipital resume.  Denies any dysarthria, dysphagia, dizziness, tremor, tingling, sensory change, focal/generalized weakness, seizure and loss of consciousness. Patient denies any chest pain, palp or shortness of breath.    ED Course:  High presentation to ED patient found borderline hypertensive blood pressure was 196/104 otherwise hemodynamically stable. BMP showed low bicarb 20 otherwise unremarkable.  CBC unremarkable.  MRI of the brain showed acute right PCA territory infarction with edema with local mass effect and parenchymal hematoma  with hemorrhagic conversion.  No midline shift.. Mild chronic microvascular ischemic changes and mild parenchymal volume loss. 3. Discussed with Dr. Glendia at 4:52 PM on 05/08/24.   MRA angio head and neck no proximal intracranial vessel occlusion.   CT head showed: Hypodense edema with local mass effect within the right occipital lobe and right posterior temporal lobe as seen on prior MRI imaging and corresponding to history of acute right PCA infarct. Positive for hemorrhagic conversion with approximate 2.7 cm hematoma in the region of hypodense edema. Small volume intraventricular blood within the right lateral ventricle.  Mild chronic small vessel ischemic changes of the white matter. Critical test finding documented on the previously performed MRI report.  ED physician Dr. Sharyne consulted neurology Dr. Lyndall.  Per neurology at the evidence of the stroke on MRI matches the timeline of temporal vision blurriness.  Per neurology patient need admission for further stroke workup, recommended good blood pressure control and recommended to repeat head CT scan in the a.m.  Hospitalist has been consulted for further evaluation and management of hemorrhagic stroke and hypertensive emergency   Significant labs in the ED: Lab Orders         CBC         Basic metabolic panel         Lipid panel         Hemoglobin A1c         CBC         Comprehensive metabolic panel       Review of Systems:  Review of Systems  Constitutional:  Negative for chills, fever, malaise/fatigue and weight loss.  Eyes:  Positive for blurred vision. Negative  for double vision, photophobia, pain, discharge and redness.  Cardiovascular:  Negative for chest pain and palpitations.  Neurological:  Positive for headaches. Negative for dizziness, tingling, sensory change, speech change, focal weakness, seizures, loss of consciousness and weakness.  Psychiatric/Behavioral:  The patient is not nervous/anxious.      Past Medical History:  Diagnosis Date   Borderline hyperlipidemia    Cancer (HCC)    Frequent headaches    Peptic ulcer    Vertigo     Past Surgical History:  Procedure Laterality Date   INGUINAL HERNIA REPAIR       reports that he has never smoked. He has never used smokeless tobacco. He reports current alcohol use. He reports that he does not use drugs.  Allergies  Allergen Reactions   Strawberry (Diagnostic) Anaphylaxis    Family History  Problem Relation Age of Onset   Colon polyps Mother    Stomach cancer Mother    Liver cancer Mother    Ovarian cancer Mother    Breast cancer Sister    Colon polyps Brother    Colon cancer Neg Hx    Esophageal cancer Neg Hx    Rectal cancer Neg Hx     Prior to Admission medications   Medication Sig Start Date End Date Taking? Authorizing Provider  atorvastatin (LIPITOR) 80 MG tablet Take 80 mg by mouth daily. Patient not taking: Reported on 10/15/2022 07/10/22   [provider]  cetirizine (ZYRTEC) 10 MG tablet Take 10 mg by mouth daily.    [provider]  folic acid (FOLVITE) 1 MG tablet Take 1 mg by mouth daily.    [provider]  omeprazole  (PRILOSEC) 40 MG capsule Take 1 capsule (40 mg total) by mouth 2 (two) times daily for 14 days. 09/28/19 10/12/19  Pyrtle, Gordy HERO, MD  pantoprazole  (PROTONIX ) 40 MG tablet TAKE 1 TABLET(40 MG) BY MOUTH DAILY Patient not taking: Reported on 10/15/2022 10/14/19   Albertus Gordy HERO, MD  SUPREP BOWEL PREP  KIT 17.5-3.13-1.6 GM/177ML SOLN Suprep-Use as directed 09/25/22   Pyrtle, Gordy HERO, MD  vitamin B-12 (CYANOCOBALAMIN) 100 MCG tablet Take 100 mcg by mouth daily.    [provider]     Physical Exam: Vitals:   05/08/24 1815 05/08/24 1915 05/08/24 1945 05/08/24 2000  BP: (!) 161/96 (!) 167/101 (!) 173/102 (!) 176/96  Pulse: 75 67 70 68  Resp: (!) 8 (!) 8 17 18   Temp:      TempSrc:      SpO2: 100% 100% 100% 100%  Weight:      Height:        Physical  Exam Constitutional:      General: He is not in acute distress.    Appearance: He is well-developed. He is not ill-appearing.  Eyes:     Pupils: Pupils are equal, round, and reactive to light.  Cardiovascular:     Rate and Rhythm: Normal rate and regular rhythm.     Heart sounds: Normal heart sounds.  Pulmonary:     Effort: Pulmonary effort is normal.     Breath sounds: Normal breath sounds.  Abdominal:     General: Bowel sounds are normal.     Palpations: Abdomen is soft.  Musculoskeletal:     Cervical back: Normal range of motion and neck supple.  Skin:    General: Skin is dry.     Capillary Refill: Capillary refill takes less than 2 seconds.  Neurological:     Mental Status: He  is alert and oriented to person, place, and time.     GCS: GCS eye subscore is 4. GCS verbal subscore is 5. GCS motor subscore is 6.     Cranial Nerves: No cranial nerve deficit, dysarthria or facial asymmetry.     Sensory: No sensory deficit.     Motor: No weakness.     Deep Tendon Reflexes: Reflexes normal.  Psychiatric:        Mood and Affect: Mood normal. Mood is not anxious.        Speech: Speech normal.        Cognition and Memory: Cognition is not impaired. Memory is not impaired.      Labs on Admission: I have personally reviewed following labs and imaging studies  CBC: Recent Labs  Lab 05/08/24 1409  WBC 9.1  HGB 14.1  HCT 42.9  MCV 88.5  PLT 216   Basic Metabolic Panel: Recent Labs  Lab 05/08/24 1409  NA 136  K 4.0  CL 105  CO2 20*  GLUCOSE 104*  BUN 17  CREATININE 0.94  CALCIUM 8.6*   GFR: Estimated Creatinine Clearance: 86.1 mL/min (by C-G formula based on SCr of 0.94 mg/dL). Liver Function Tests: No results for input(s): AST, ALT, ALKPHOS, BILITOT, PROT, ALBUMIN in the last 168 hours. No results for input(s): LIPASE, AMYLASE in the last 168 hours. No results for input(s): AMMONIA in the last 168 hours. Coagulation Profile: No results for  input(s): INR, PROTIME in the last 168 hours. Cardiac Enzymes: No results for input(s): CKTOTAL, CKMB, CKMBINDEX, TROPONINI, TROPONINIHS in the last 168 hours. BNP (last 3 results) No results for input(s): BNP in the last 8760 hours. HbA1C: No results for input(s): HGBA1C in the last 72 hours. CBG: No results for input(s): GLUCAP in the last 168 hours. Lipid Profile: No results for input(s): CHOL, HDL, LDLCALC, TRIG, CHOLHDL, LDLDIRECT in the last 72 hours. Thyroid Function Tests: No results for input(s): TSH, T4TOTAL, FREET4, T3FREE, THYROIDAB in the last 72 hours. Anemia Panel: No results for input(s): VITAMINB12, FOLATE, FERRITIN, TIBC, IRON, RETICCTPCT in the last 72 hours. Urine analysis: No results found for: COLORURINE, APPEARANCEUR, LABSPEC, PHURINE, GLUCOSEU, HGBUR, BILIRUBINUR, KETONESUR, PROTEINUR, UROBILINOGEN, NITRITE, LEUKOCYTESUR  Radiological Exams on Admission: I have personally reviewed images MRI brain: EXAM: MRI BRAIN WITHOUT CONTRAST 05/08/2024 04:11:40 PM   TECHNIQUE: Multiplanar multisequence MRI of the head/brain was performed without the administration of intravenous contrast.   COMPARISON: Head CT dated 08/27/2019.   CLINICAL HISTORY: Homonomous upper left quadrantanopsia. Patient complains of visual field deficit. Patient woke up Thursday morning and noted a visual field deficit in both eyes, primarily the left eye. Was seen by an eye doctor today, retina/optic nerve exam was normal but he was found to have homonymous upper left quadrantanopsia, was instructed to come to the emergency department for additional testing. He reports a left-sided occipital headache as well.   FINDINGS:   BRAIN AND VENTRICLES: Restriction diffusion throughout the right PCA (posterior cerebral artery) territory particularly within the cortex and subcortical white matter of the right  occipital lobe. Associated edema and local mass effect. Within this region there is an area of prominent susceptibility and signal abnormality measuring approximately 2.4 cm in size concerning for parenchymal hematoma suggestive of hemorrhagic conversion of infarct. No midline shift. Scattered areas of T2/FLAIR hyperintensity in the periventricular and subcortical white matter suggestive of mild chronic microvascular ischemic changes. Mild parenchymal volume loss.   ORBITS: No acute abnormality.   SINUSES AND MASTOIDS:  No acute abnormality.   BONES AND SOFT TISSUES: Normal marrow signal. No acute soft tissue abnormality.   IMPRESSION: 1. Acute right PCA territory infarct with associated edema, local mass effect, and parenchymal hematoma (hemorrhagic conversion). No midline shift. Consider CT head for further evaluation of hemorrhage. 2. Mild chronic microvascular ischemic changes and mild parenchymal volume loss. 3. Discussed with Dr. Glendia at 4:52 PM on 05/08/24.   Electronically signed by: Donnice Mania MD 05/08/2024 05:13 PM EDT RP Workstation: [id number]  MRA head and neck: CLINICAL DATA:  Provided history: Homonymous upper left quadrantanopia. Brain MRI performed earlier today   EXAM: MRA HEAD WITHOUT CONTRAST   TECHNIQUE: Angiographic images of the Circle of Willis were acquired using MRA technique without intravenous contrast.   COMPARISON:  Same-day brain MRI 05/08/2024.  Head CT 08/27/2019.   FINDINGS: Anterior circulation:   The intracranial internal carotid arteries are patent. The M1 middle cerebral arteries are patent. No M2 proximal branch occlusion or high-grade proximal stenosis. The anterior cerebral arteries are patent. No intracranial aneurysm is identified.   Posterior circulation:   The intracranial vertebral arteries are patent. The basilar artery is patent. The posterior cerebral arteries are patent proximally without high-grade stenosis.  Posterior communicating arteries are diminutive or absent, bilaterally.   Anatomic variants: As described.   IMPRESSION: 1. No proximal intracranial large vessel occlusion or high-grade proximal arterial stenosis identified. 2. Brain MRI findings separately reported.     CT head: CLINICAL DATA:  Loss of vision, known PCA infarct with hemorrhagic conversion   EXAM: CT HEAD WITHOUT CONTRAST   TECHNIQUE: Contiguous axial images were obtained from the base of the skull through the vertex without intravenous contrast.   RADIATION DOSE REDUCTION: This exam was performed according to the departmental dose-optimization program which includes automated exposure control, adjustment of the mA and/or kV according to patient size and/or use of iterative reconstruction technique.   COMPARISON:  MRI 05/08/2024, CT brain 08/27/2019   FINDINGS: Brain: Hypodense edema with local mass effect within the right occipital lobe and right posterior temporal lobe as seen on prior MRI imaging. Focal hemorrhage within the area of edema measuring approximately 2.7 x 1.7 x 2.1 cm (estimated volume of 4.8 mL). Small volume intraventricular blood within the posterior right lateral ventricle. No midline shift. Basilar cisterns remain patent. The ventricles are nonenlarged. Mild white matter hypodensity likely chronic small vessel ischemic change.   Vascular: No hyperdense vessels.  Carotid vascular calcification   Skull: Normal. Negative for fracture or focal lesion.   Sinuses/Orbits: No acute finding.   Other: None   IMPRESSION: Hypodense edema with local mass effect within the right occipital lobe and right posterior temporal lobe as seen on prior MRI imaging and corresponding to history of acute right PCA infarct. Positive for hemorrhagic conversion with approximate 2.7 cm hematoma in the region of hypodense edema. Small volume intraventricular blood within the right lateral ventricle.    Mild chronic small vessel ischemic changes of the white matter.   Critical test finding documented on the previously performed MRI report.   Electronically Signed   By: Luke Bun M.D.   On: 05/08/2024 17:46     Assessment/Plan: Principal Problem:   Hemorrhagic stroke [facility]) Active Problems:   Hypertensive emergency   Blurry vision, bilateral   Essential hypertension   Chronic vertigo   Hyperlipidemia    Assessment and Plan: Acute ischemic stroke >> converted to hemorrhagic stroke Bitemporal patient blurriness-secondary to CVA -Present emergency department complaining of blurry  vision that is started 7/30 1 in the morning.  Patient reported when he looks straight vision is normal however he has been noticing bilateral temporal visual blurriness.  Went to [facility] today morning for evaluation and patient has been referred to emergency department for further evaluation.  At presentation to ED patient found hypertensive blood pressure 196/104 otherwise hemodynamically stable.  CBC and BMP unremarkable.  Pending EKG. -MRI of the brain showed acute right PCA territory infarction with edema with local mass effect and parenchymal hematoma with hemorrhagic conversion.  No midline shift.. Mild chronic microvascular ischemic changes and mild parenchymal volume loss. - MRA angio head and neck no proximal intracranial vessel occlusion. -Following CT head showed: Hypodense edema with local mass effect within the right occipital lobe and right posterior temporal lobe as seen on prior MRI imaging and corresponding to history of acute right PCA infarct. Positive for hemorrhagic conversion with approximate 2.7 cm hematoma in the region of hypodense edema. Small volume intraventricular blood within the right lateral ventricle.  -Hemorrhagic stroke in the setting of hypertensive emergency.  In this case need to good blood pressure control. - Starting oral antihypertensive regimen amlodipine 10 mg  daily. - Neurology being started labetalol IV 10 mg for good blood pressure control systolic blood pressure above 839. -Goal systolic blood pressure between 130-140 and diastolic blood pressure in between 80-90. -ED physician Dr. Sharyne consulted neurology Dr. Lyndall.  Per neurology at the evidence of the stroke on MRI matches the timeline of temporal vision blurriness.  Per neurology patient need admission for further stroke workup, recommended good blood pressure control and recommended to repeat head CT scan in the a.m. - Repeat head CT at 5 AM 8/2. - Continue neurocheck every 2 hours. Checking A1c and lipid panel. -Avoid antiplatelet and anticoagulation in the setting of hemorrhagic CVA. -Obtaining echocardiogram. - Continue cardiac monitoring - Will follow-up with formal neurology recommendation.   Hypertensive emergency History of essential hypertension -History of essential hypertension however at home patient not taking any regimen. - Starting oral amlodipine 10 mg daily.  Continue labetalol as needed.  Chronic vertigo -Continue meclizine as needed  Hyperlipidemia -Continue Lipitor  Gerd -Continue Protonix     DVT prophylaxis:  SCDs Code Status:  Full Code Diet: Heart healthy diet Family Communication:   Family was present at bedside, at the time of interview. Opportunity was given to ask question and all questions were answered satisfactorily.  Disposition Plan: Need to follow-up with repeat head CT scan and optimal blood pressure control Consults: Neurology Admission status:   Inpatient, Step Down Unit  Severity of Illness: The appropriate patient status for this patient is INPATIENT. Inpatient status is judged to be reasonable and necessary in order to provide the required intensity of service to ensure the patient's safety. The patient's presenting symptoms, physical exam findings, and initial radiographic and laboratory data in the context of their chronic  comorbidities is felt to place them at high risk for further clinical deterioration. Furthermore, it is not anticipated that the patient will be medically stable for discharge from the hospital within 2 midnights of admission.   * I certify that at the point of admission it is my clinical judgment that the patient will require inpatient hospital care spanning beyond 2 midnights from the point of admission due to high intensity of service, high risk for further deterioration and high frequency of surveillance required.DEWAINE    Tema Alire, MD [facility]  How to contact the [facility] Attending  or Consulting provider 7A - 7P or covering provider during after hours 7P -7A, for this patient.  Check the care team in [facility] and look for a) attending/consulting [facility] provider listed and b) the [facility] team listed Log into [email or web address] and use [place]'s universal password to access. If you do not have the password, please contact the hospital operator. Locate the [facility] provider you are looking for under [facility] and page to a number that you can be directly reached. If you still have difficulty reaching the provider, please page the [facility] (Director on Call) for the Hospitalists listed on amion for assistance.  05/08/2024, 8:09 PM

## 2024-05-08 NOTE — ED Provider Triage Note (Addendum)
 Emergency Medicine Provider Triage Evaluation Note  Gordon Jacobs , a 73 y.o. male  was evaluated in triage.  Pt complains of visual field deficit.  Patient woke up Thursday morning and noted a visual field deficit in both eyes, primarily the left eye.  Was seen by an eye doctor today, retina/optic nerve exam was normal but he was found to have homonymous upper left quadrantanopsia, was instructed to come to the emergency department for additional testing.  He reports a left-sided occipital headache as well.  Review of Systems  Positive: As above Negative: As above  Physical Exam  BP (!) 167/114 (BP Location: Left Arm)   Pulse 86   Temp 98.6 F (37 C)   Resp 20   Ht 6' 3 (1.905 m)   Wt 95.3 kg   SpO2 98%   BMI 26.25 kg/m  Gen:   Awake, no distress   Resp:  Normal effort  MSK:   Moves extremities without difficulty  Other:  L temporal visual field deficit, no other focal neurologic deficits  Medical Decision Making  Medically screening exam initiated at 1:47 PM.  Appropriate orders placed.  [name] was informed that the remainder of the evaluation will be completed by another provider, this initial triage assessment does not replace that evaluation, and the importance of remaining in the ED until their evaluation is complete.  5pm: I was contacted by the radiologist, Dr. Perley, who tells me that this patient appears to have had a R PCA infarct with likely hemorrhagic conversion, I ordered a non-contrast head CT at the radiologists recommendation. Nursing staff is aware that this patient needs to be moved to a room, as he is still waiting in the lobby.    Glendia Rocky SAILOR, [place] 05/08/24 1655

## 2024-05-08 NOTE — ED Triage Notes (Signed)
 Pt came in via POV d/t yesterday waking up with tunnel vision in both eyes & flashing lights in the Lt eye, plus decreased to hardly any peripheral vision. Reports he thought it may be his vertigo acting up so he did his maneuver with no changes & went to his eye Dr. ODESSIA was sent  here d/t concerns that these s/s are in need for a neuro stand point eval. A/Ox4, rates his pain 2/10 during triage from a constant HA.

## 2024-05-08 NOTE — ED Notes (Signed)
 Pt taken to CT.

## 2024-05-08 NOTE — ED Notes (Signed)
 Pt. Called x3 in lobby w/ no response. Moved OTF

## 2024-05-08 NOTE — ED Notes (Addendum)
 Pt off floor to imaging when name was called previously, pt currently in lobby awaiting room

## 2024-05-09 ENCOUNTER — Inpatient Hospital Stay (HOSPITAL_COMMUNITY)

## 2024-05-09 DIAGNOSIS — I6389 Other cerebral infarction: Secondary | ICD-10-CM

## 2024-05-09 DIAGNOSIS — R29702 NIHSS score 2: Secondary | ICD-10-CM

## 2024-05-09 DIAGNOSIS — I639 Cerebral infarction, unspecified: Secondary | ICD-10-CM

## 2024-05-09 DIAGNOSIS — I619 Nontraumatic intracerebral hemorrhage, unspecified: Secondary | ICD-10-CM | POA: Diagnosis not present

## 2024-05-09 LAB — ECHOCARDIOGRAM COMPLETE
Area-P 1/2: 3.33 cm2
Height: 75 in
S' Lateral: 3.5 cm
Weight: 3360 [oz_av]

## 2024-05-09 LAB — LIPID PANEL
Cholesterol: 145 mg/dL (ref 0–200)
HDL: 61 mg/dL (ref >40)
LDL Cholesterol: 66 mg/dL (ref 0–99)
Total CHOL/HDL Ratio: 2.4 ratio
Triglycerides: 88 mg/dL (ref <150)
VLDL: 18 mg/dL (ref 0–40)

## 2024-05-09 LAB — CBC
HCT: 40.8 % (ref 39.0–52.0)
Hemoglobin: 13.5 g/dL (ref 13.0–17.0)
MCH: 29.2 pg (ref 26.0–34.0)
MCHC: 33.1 g/dL (ref 30.0–36.0)
MCV: 88.3 fL (ref 80.0–100.0)
Platelets: 197 K/uL (ref 150–400)
RBC: 4.62 MIL/uL (ref 4.22–5.81)
RDW: 13.2 % (ref 11.5–15.5)
WBC: 7.7 K/uL (ref 4.0–10.5)
nRBC: 0 % (ref 0.0–0.2)

## 2024-05-09 MED ORDER — IOHEXOL 350 MG/ML SOLN
75.0000 mL | Freq: Once | INTRAVENOUS | Status: AC | PRN
  Administered 2024-05-09: 75 mL via INTRAVENOUS

## 2024-05-09 MED ORDER — PERFLUTREN LIPID MICROSPHERE
1.0000 mL | INTRAVENOUS | Status: AC | PRN
  Administered 2024-05-09: 5 mL via INTRAVENOUS

## 2024-05-09 NOTE — Progress Notes (Signed)
 Back to unit, hooked back to tele monitoring.

## 2024-05-09 NOTE — Evaluation (Signed)
 Occupational Therapy Evaluation Patient Details Name: Gordon Jacobs MRN: [id number] DOB: September 26, 1951 Today's Date: 05/09/2024   History of Present Illness   73 y.o. male admitted 05/08/24 with headache, decreased peripheral and blurred vision, referred to ED from ophthalmologist. 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 Repeat head CT 8/2 shows no new acute abnormality from initial imaging. PMH includes HLD, vertigo.     Clinical Impressions Pt admitted for above, PTA pt lived with spouse and reports being fully ind with his ADLs/IADLS. Pt currently ambulating ind no AD, upon assessment the pt demonstrates L field cuts impacting safety with mobility. Educated pt on field cuts and compensatory strategies as well as fall prevention strategies. Pt demonstrated good awareness of deficits, needed cues to reinforce strategies during letter E cancellation testing. Pt completing ADLs ind still. OT will follow pt while acute for more visual activities to compensate. Recommend outpatient Neuro OT and driving rehab for pt given visual deficits.      If plan is discharge home, recommend the following:   Assist for transportation     Functional Status Assessment   Patient has had a recent decline in their functional status and/or demonstrates limited ability to make significant improvements in function in a reasonable and predictable amount of time     Equipment Recommendations   None recommended by OT     Recommendations for Other Services         Precautions/Restrictions   Precautions Precautions: Other (comment) Recall of Precautions/Restrictions: Intact Precaution/Restrictions Comments: still has some peripheral vision deficits (L>R) Restrictions Weight Bearing Restrictions Per Provider Order: No     Mobility Bed Mobility Overal bed mobility: Independent                   Transfers Overall transfer level: Independent                        Balance Overall balance assessment: Mild deficits observed, not formally tested                                         ADL either performed or assessed with clinical judgement   ADL Overall ADL's : Independent                                       General ADL Comments: Educated pt on visual scanning compensation and activities     Vision Baseline Vision/History: 0 No visual deficits Patient Visual Report: Peripheral vision impairment Vision Assessment?: Yes Eye Alignment: Within Functional Limits Ocular Range of Motion: Within Functional Limits Alignment/Gaze Preference: Within Defined Limits Tracking/Visual Pursuits: Able to track stimulus in all quads without difficulty Visual Fields: Left visual field deficit Additional Comments: L eye L field cut, R eye with mild LLQ defiicit but more pronounced LUQ deficit.     Perception Perception: Within Functional Limits       Praxis Praxis: WFL       Pertinent Vitals/Pain Pain Assessment Pain Assessment: Faces Faces Pain Scale: Hurts a little bit Pain Location: headache Pain Descriptors / Indicators: Headache Pain Intervention(s): Monitored during session     Extremity/Trunk Assessment Upper Extremity Assessment Upper Extremity Assessment: Overall WFL for tasks assessed;Right hand dominant  Lower Extremity Assessment Lower Extremity Assessment: Overall WFL for tasks assessed   Cervical / Trunk Assessment Cervical / Trunk Assessment: Normal   Communication Communication Communication: No apparent difficulties   Cognition Arousal: Alert Behavior During Therapy: WFL for tasks assessed/performed Cognition: No apparent impairments                               Following commands: Intact       Cueing  General Comments   Cueing Techniques: Verbal cues  educ on visual scanning  strategies given peripheral vision deficits   Exercises     Shoulder Instructions      Home Living Family/patient expects to be discharged to:: Private residence Living Arrangements: Spouse/significant other Available Help at Discharge: Family;Available 24 hours/day Type of Home: House Home Access: Stairs to enter [employer] of Steps: 2 Entrance Stairs-Rails: None (post on R-side if needed for UE support) Home Layout: One level     Bathroom Shower/Tub: Walk-in shower                    Prior Functioning/Environment Prior Level of Function : Independent/Modified Independent;Working/employed;Driving             Mobility Comments: indep without DME, enjoys walking and reading, 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 [employer] ADLs Comments: independent, enjoys cooking with wife    OT Problem List: Impaired vision/perception   OT Treatment/Interventions:        OT Goals(Current goals can be found in the care plan section)   Acute Rehab OT Goals Patient Stated Goal: go home OT Goal Formulation: With patient Time For Goal Achievement: 05/23/24 Potential to Achieve Goals: Good   OT Frequency:       Co-evaluation              AM-PAC OT 6 Clicks Daily Activity     Outcome Measure Help from another person eating meals?: None Help from another person taking care of personal grooming?: None Help from another person toileting, which includes using toliet, bedpan, or urinal?: None Help from another person bathing (including washing, rinsing, drying)?: None Help from another person to put on and taking off regular upper body clothing?: None Help from another person to put on and taking off regular lower body clothing?: None 6 Click Score: 24   End of Session Nurse Communication: Mobility status  Activity Tolerance: Patient tolerated treatment well Patient left: in bed;with call bell/phone within reach  OT Visit Diagnosis:  Low vision, both eyes (H54.2)                Time: 1003-1030 OT Time Calculation (min): 27 min Charges:  OT General Charges $OT Visit: 1 Visit OT Evaluation $OT Eval Low Complexity: 1 Low OT Treatments $Therapeutic Activity: 8-22 mins  05/09/2024  AB, OTR/L  Acute Rehabilitation Services  Office: [phone]   Curtistine JONETTA Das 05/09/2024, 11:04 AM

## 2024-05-09 NOTE — Plan of Care (Signed)
  Repeat head CT scan at 5 AM showing a stable right PCA infarction with confluent petechial hemorrhage.  No progression or regional mass effect such as malignant hemorrhagic transformation.  There is no new intracranial abnormality.

## 2024-05-09 NOTE — Progress Notes (Addendum)
 STROKE TEAM PROGRESS NOTE    INTERIM HISTORY/SUBJECTIVE  Patient continues to report left hemianopsia.  He remains hemodynamically stable and afebrile and has no other neurological deficits.  OBJECTIVE  CBC    Component Value Date/Time   WBC 7.7 05/09/2024 0408   RBC 4.62 05/09/2024 0408   HGB 13.5 05/09/2024 0408   HCT 40.8 05/09/2024 0408   PLT 197 05/09/2024 0408   MCV 88.3 05/09/2024 0408   MCH 29.2 05/09/2024 0408   MCHC 33.1 05/09/2024 0408   RDW 13.2 05/09/2024 0408    BMET    Component Value Date/Time   NA 138 05/08/2024 2104   K 3.6 05/08/2024 2104   CL 107 05/08/2024 2104   CO2 19 (L) 05/08/2024 2104   GLUCOSE 92 05/08/2024 2104   BUN 14 05/08/2024 2104   CREATININE 0.92 05/08/2024 2104   CALCIUM  8.6 (L) 05/08/2024 2104   GFRNONAA >60 05/08/2024 2104    IMAGING past 24 hours   Vitals:   05/08/24 2037 05/09/24 0031 05/09/24 0420 05/09/24 1228  BP: (!) 163/97 (!) 145/98 (!) 148/103 (!) 155/91  Pulse:  73 82 89  Resp: 18 18 18    Temp: 97.7 F (36.5 C) 98 F (36.7 C) 98 F (36.7 C) 98.1 F (36.7 C)  TempSrc: Oral Oral Oral Oral  SpO2: 99% 98% 97% 97%  Weight:      Height:         PHYSICAL EXAM General:  Alert, well-nourished, well-developed patient in no acute distress Psych:  Mood and affect appropriate for situation CV: Regular rate and rhythm on monitor Respiratory:  Regular, unlabored respirations on room air GI: Abdomen soft and nontender   NEURO:  Mental Status: AA&Ox3, patient is able to give clear and coherent history Speech/Language: speech is without dysarthria or aphasia.  Naming, repetition, fluency, and comprehension intact.  Cranial Nerves:  II: PERRL.  Left hemianopsia III, IV, VI: EOMI. Eyelids elevate symmetrically.  V: Sensation is intact to light touch and symmetrical to face.  VII: Face is symmetrical resting and smiling VIII: hearing intact to voice. IX, X: Palate elevates symmetrically. Phonation is normal.   KP:Dynloizm shrug 5/5. XII: tongue is midline without fasciculations. Motor: 5/5 strength to all muscle groups tested.  Tone: is normal and bulk is normal Sensation- Intact to light touch bilaterally. Extinction absent to light touch to DSS.   Coordination: FTN intact bilaterally Gait- deferred  Most Recent NIH  1a Level of Conscious.: 0 1b LOC Questions: 0 1c LOC Commands: 0 2 Best Gaze: 0 3 Visual: 2 4 Facial Palsy: 0 5a Motor Arm - left: 0 5b Motor Arm - Right: 0 6a Motor Leg - Left: 0 6b Motor Leg - Right: 0 7 Limb Ataxia: 0 8 Sensory: 0 9 Best Language: 0 10 Dysarthria: 0 11 Extinct. and Inatten.: 0 TOTAL: 2   ASSESSMENT/PLAN  [name] is a 73 y.o. male with history of hyperlipidemia and vertigo admitted for acute onset left hemianopsia.  He was found on MRI to have a right occipital infarct with hemorrhagic conversion.  This was confirmed to be stable with repeat CT.  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 CT head acute right PCA territory infarct with hemorrhagic conversion and 2.7 cm hematoma Follow-up head CT stable right PCA territory infarct with confluent petechial hemorrhage CTA neck no LVO, generalized arterial tortuosity MRI acute right PCA territory infarct with associated edema, local  mass effect and parenchymal hematoma, mild chronic microvascular ischemic changes and mild parenchymal volume loss MRA no LVO or high-grade proximal arterial stenosis 2D Echo EF 55 to 60%, no atrial level shunt LE venous Doppler pending Pt prefer 30-day cardiac monitor first, if negative will consider Loop recorder LDL 66 HgbA1c pending VTE prophylaxis - SCDs No antithrombotic prior to admission, now on No antithrombotic for now given hemorrhagic conversion Therapy recommendations:  Outpatient PT/OT/ST Disposition: Pending   Hypertension Home meds: None Stable on the  high end On Norvasc  10 Keep systolic blood pressure less than 160 Long-term BP goal normotensive  Hyperlipidemia Home meds: Atorvastatin  80 mg daily, resumed in hospital LDL 66, goal < 70 Continue statin at discharge  Other Stroke Risk Factors Advanced age  Other Active Problems No driving at this time, pt is aware, follow up with ophthalmology and driving rehab  Hospital day # 1  Patient seen by NP and then by MD, MD to edit note as needed. Cortney E Everitt Clint Kill , MSN, AGACNP-BC [facility] See Amion for schedule and pager information 05/09/2024 2:25 PM   ATTENDING NOTE: I reviewed above note and agree with the assessment and plan. Pt was seen and examined.   No family at bedside.  Patient still has left hemianopia more of left upper quadrantanopsia and decreased acuity at the left lower quadrant.  He feels some improvement of left lower quadrant since yesterday.  However patient is aware that he cannot drive at this time, will need to follow-up with ophthalmology and driving rehab as outpatient.  Given hemorrhagic transmission, no antiplatelet at this time.  Continue Lipitor  80.  BP on the high end, add Norvasc  10.  Pending LE venous Doppler.  He prefers 30-day CardioNet monitoring first, if negative consider loop recorder.  Will follow  For detailed assessment and plan, please refer to above as I have made changes wherever appropriate.   Ary Cummins, MD PhD Stroke Neurology 05/09/2024 10:22 PM   To contact Stroke Continuity provider, please refer to [email or web address]. After hours, contact General Neurology

## 2024-05-09 NOTE — Consult Note (Signed)
 NEUROLOGY CONSULT NOTE   Date of service: May 09, 2024 Patient Name: Gordon Jacobs MRN:  [id number] DOB:  [birth year] Chief Complaint: Visual change Requesting Provider: Sundil, Subrina, MD  History of Present Illness  Gordon Jacobs is a 73 y.o. male with hx of hyperlipidemia, vertigo who presents with visual change that started abruptly on awakening Thursday morning.  He states that he awoke on Thursday morning with a headache as well as visual change which has been stable, and actually slightly improving since that time.  He states that he has not been running into things, but has had difficulty due to inability to see things on his left side.  He has not been driving due to this deficit.  LKW: Wednesday evening Modified rankin score: 0-Completely asymptomatic and back to baseline post- stroke IV Thrombolysis: No, outside of window EVT: No, outside of window  NIHSS components Score: Comment  1a Level of Conscious 0[]  1[]  2[]  3[]      1b LOC Questions 0[]  1[]  2[]       1c LOC Commands 0[]  1[]  2[]       2 Best Gaze 0[]  1[]  2[]       3 Visual 0[]  1[]  2[x]  3[]      4 Facial Palsy 0[]  1[]  2[]  3[]      5a Motor Arm - left 0[]  1[]  2[]  3[]  4[]  UN[]    5b Motor Arm - Right 0[]  1[]  2[]  3[]  4[]  UN[]    6a Motor Leg - Left 0[]  1[]  2[]  3[]  4[]  UN[]    6b Motor Leg - Right 0[]  1[]  2[]  3[]  4[]  UN[]    7 Limb Ataxia 0[]  1[]  2[]  UN[]      8 Sensory 0[]  1[]  2[]  UN[]      9 Best Language 0[]  1[]  2[]  3[]      10 Dysarthria 0[]  1[]  2[]  UN[]      11 Extinct. and Inattention 0[]  1[]  2[]       TOTAL: 2      Past History   Past Medical History:  Diagnosis Date   Borderline hyperlipidemia    Cancer (HCC)    Frequent headaches    Peptic ulcer    Vertigo     Past Surgical History:  Procedure Laterality Date   INGUINAL HERNIA REPAIR      Family History: Family History  Problem Relation Age of Onset   Colon polyps Mother    Stomach cancer Mother    Liver cancer Mother    Ovarian cancer Mother     Breast cancer Sister    Colon polyps Brother    Colon cancer Neg Hx    Esophageal cancer Neg Hx    Rectal cancer Neg Hx     Social History  reports that he has never smoked. He has never used smokeless tobacco. He reports current alcohol use. He reports that he does not use drugs.  Allergies  Allergen Reactions   Strawberry (Diagnostic) Anaphylaxis    Medications   Current Facility-Administered Medications:     stroke: early stages of recovery book, , Does not apply, Once, Sundil, Subrina, MD   0.9 %  sodium chloride  infusion, , Intravenous, Continuous, Sundil, Subrina, MD, Last Rate: 40 mL/hr at 05/08/24 2055, New Bag at 05/08/24 2055   acetaminophen  (TYLENOL ) tablet 650 mg, 650 mg, Oral, Q4H PRN **OR** acetaminophen  (TYLENOL ) 160 MG/5ML solution 650 mg, 650 mg, Per Tube, Q4H PRN **OR** acetaminophen  (TYLENOL ) suppository 650 mg, 650 mg, Rectal, Q4H PRN, Sundil, Subrina, MD   amLODipine  (NORVASC ) tablet 10 mg, 10 mg, Oral,  Daily, Sundil, Subrina, MD, 10 mg at 05/10/24 2156   atorvastatin  (LIPITOR ) tablet 80 mg, 80 mg, Oral, Daily, Sundil, Subrina, MD   labetalol  (NORMODYNE ) injection 10 mg, 10 mg, Intravenous, Q2H PRN, Sundil, Subrina, MD   meclizine  (ANTIVERT ) tablet 12.5 mg, 12.5 mg, Oral, TID PRN, Sundil, Subrina, MD   pantoprazole  (PROTONIX ) EC tablet 40 mg, 40 mg, Oral, Daily, Sundil, Subrina, MD   senna-docusate (Senokot-S) tablet 1 tablet, 1 tablet, Oral, QHS PRN, Sundil, Subrina, MD  Vitals   Vitals:   05/10/24 1945 2024/05/10 2000 05-10-24 2037 05/09/24 0031  BP: (!) 173/102 (!) 176/96 (!) 163/97 (!) 145/98  Pulse: 70 68  73  Resp: 17 18 18 18   Temp:   97.7 F (36.5 C) 98 F (36.7 C)  TempSrc:   Oral Oral  SpO2: 100% 100% 99% 98%  Weight:      Height:        Body mass index is 26.25 kg/m.   Physical Exam   Constitutional: Appears well-developed and well-nourished.   Neurologic Examination    Neuro: Mental Status: Patient is awake, alert, oriented to  person, place, month, year, and situation. Patient is able to give a clear and coherent history. No signs of aphasia or neglect Cranial Nerves: II: Visual Fields with a left hemianopia pupils are equal, round, and reactive to light.   III,IV, VI: EOMI without ptosis or diploplia.  V: Facial sensation is symmetric to temperature VII: Facial movement is symmetric.  VIII: hearing is intact to voice X: Uvula elevates symmetrically XII: tongue is midline without atrophy or fasciculations.  Motor: Tone is normal. Bulk is normal. 5/5 strength was present in all four extremities.  Sensory: Sensation is symmetric to light touch and temperature in the arms and legs. Cerebellar: FNF and HKS are intact bilaterally        Labs/Imaging/Neurodiagnostic studies   CBC:  Recent Labs  Lab 05-10-2024 1409  WBC 9.1  HGB 14.1  HCT 42.9  MCV 88.5  PLT 216   Basic Metabolic Panel:  Lab Results  Component Value Date   NA 138 May 10, 2024   K 3.6 10-May-2024   CO2 19 (L) 05-10-24   GLUCOSE 92 2024/05/10   BUN 14 May 10, 2024   CREATININE 0.92 05-10-24   CALCIUM  8.6 (L) 05-10-2024   GFRNONAA >60 05-10-24    CT Head without contrast(Personally reviewed): Occipital infarct with hemorrhagic conversion  MRI Brain(Personally reviewed): Same  ASSESSMENT   Gordon Jacobs is a 73 y.o. male with right occipital ischemic infarct with hemorrhagic conversion.  His symptoms have been stable since onset, I suspect that this is a subacute hemorrhage.  He already had a short interval imaging follow-up to confirm stability on that timeframe, but I would like a CT in the morning as well to confirm stability.  I would hold antiplatelets given the hemorrhagic conversion, but he will need secondary risk factor modification.  RECOMMENDATIONS  - HgbA1c, fasting lipid panel - Frequent neuro checks - Echocardiogram - CTA neck(MRA head performed) - Prophylactic therapy-none for now - Risk factor  modification - Telemetry monitoring - PT consult, OT consult, Speech consult -I advised him not to drive while he has the visual deficit - Stroke team to follow  ______________________________________________________________________    Signed, Aisha Seals, MD [facility]

## 2024-05-09 NOTE — Progress Notes (Signed)
 PROGRESS NOTE  Ladamien Rammel FMW:[id number] DOB: Jan 26, 1951 DOA: 05/08/2024 PCP: Jess Llano, MD   LOS: 1 day   Brief Narrative / Interim history: Murl Golladay is a 73 y.o. male with medical history significant of chronic vertigo, seborrheic keratosis, GERD, aortic atherosclerosis, essential hypertension, inguinal hernia, and hyperlipidemia has been referred from [facility] to emergency department for complaining of bilateral peripheral vision loss/blurriness that started Thursday morning 7/31   Subjective / 24h Interval events: Doing well, appreciates his vision has improved significantly  Assesement and Plan: Principal Problem:   Hemorrhagic stroke [facility]) Active Problems:   Hypertensive emergency   Blurry vision, bilateral   Essential hypertension   Chronic vertigo   Hyperlipidemia  Principal problem Acute CVA with hemorrhagic conversion -on admission, MRI of the brain showed acute right PCA territory infarction with edema with local mass effect and parenchymal hematoma, hemorrhagic conversion.  He did not have any midline shift.  MR angiogram head and neck without any large vessel occlusions.  Neurology consulted and followed patient while hospitalized.  CT angio head and neck without LVO.  2D echo showing LVEF 55-60%, normal function, grade 1 diastolic dysfunction.  RV size was normal..  Therapy recommends outpatient PT.  Lipid panel shows an LDL of 66.  A1c pending - Her extremity Dopplers for DVT are pending  Active problems Hyperlipidemia-already on statin  Elevated blood pressure-he tells me he does not have hypertension, donates blood every other month and he is always normotensive.  Blood pressure is a little bit higher here, has been placed on amlodipine  due to bleed.  Continue to closely monitor  Scheduled Meds:   stroke: early stages of recovery book   Does not apply Once   amLODipine   10 mg Oral Daily   atorvastatin   80 mg Oral Daily   pantoprazole   40 mg Oral  Daily   Continuous Infusions:  sodium chloride  Stopped (05/09/24 0430)   PRN Meds:.acetaminophen  **OR** acetaminophen  (TYLENOL ) oral liquid 160 mg/5 mL **OR** acetaminophen , labetalol , meclizine , senna-docusate  Current Outpatient Medications  Medication Instructions   Ascorbic Acid (VITAMIN C PO) 1 tablet, Oral, Every morning   atorvastatin  (LIPITOR ) 80 mg, Oral, Daily at bedtime   cetirizine (ZYRTEC) 10 mg, Oral, As needed   Cholecalciferol (VITAMIN D3 PO) 1 tablet, Every morning   ibuprofen (ADVIL) 400 mg, Oral, Every 6 hours PRN   vitamin B-12 (CYANOCOBALAMIN) 100 mcg, Oral, Every morning    Diet Orders (From admission, onward)     Start     Ordered   05/08/24 1950  Diet Heart Room service appropriate? Yes; Fluid consistency: Thin  Diet effective now       Question Answer Comment  Room service appropriate? Yes   Fluid consistency: Thin      05/08/24 1949            DVT prophylaxis: Place and maintain sequential compression device Start: 05/08/24 1941 Place TED hose Start: 05/08/24 1941 SCD's Start: 05/08/24 1940   Lab Results  Component Value Date   PLT 197 05/09/2024      Code Status: Full Code  Family Communication: No family at bedside  Status is: Inpatient Remains inpatient appropriate because: Workup underway   Level of care: Progressive  Consultants:  Neurology  Objective: Vitals:   05/08/24 2000 05/08/24 2037 05/09/24 0031 05/09/24 0420  BP: (!) 176/96 (!) 163/97 (!) 145/98 (!) 148/103  Pulse: 68  73 82  Resp: 18 18 18 18   Temp:  97.7 F (36.5 C) 98  F (36.7 C) 98 F (36.7 C)  TempSrc:  Oral Oral Oral  SpO2: 100% 99% 98% 97%  Weight:      Height:        Intake/Output Summary (Last 24 hours) at 05/09/2024 0853 Last data filed at 05/09/2024 0757 Gross per 24 hour  Intake 461.95 ml  Output 650 ml  Net -188.05 ml   Wt Readings from Last 3 Encounters:  05/08/24 95.3 kg  10/15/22 95.3 kg  09/25/22 95.3 kg     Examination:  Constitutional: NAD Eyes: no scleral icterus ENMT: Mucous membranes are moist.  Neck: normal, supple Respiratory: clear to auscultation bilaterally, no wheezing, no crackles. Normal respiratory effort. No accessory muscle use.  Cardiovascular: Regular rate and rhythm, no murmurs / rubs / gallops. Abdomen: non distended, no tenderness. Bowel sounds positive.   Data Reviewed: I have independently reviewed following labs and imaging studies   CBC Recent Labs  Lab 05/08/24 1409 05/09/24 0408  WBC 9.1 7.7  HGB 14.1 13.5  HCT 42.9 40.8  PLT 216 197  MCV 88.5 88.3  MCH 29.1 29.2  MCHC 32.9 33.1  RDW 13.1 13.2    Recent Labs  Lab 05/08/24 1409 05/08/24 2104  NA 136 138  K 4.0 3.6  CL 105 107  CO2 20* 19*  GLUCOSE 104* 92  BUN 17 14  CREATININE 0.94 0.92  CALCIUM  8.6* 8.6*  AST  --  20  ALT  --  17  ALKPHOS  --  72  BILITOT  --  1.6*  ALBUMIN  --  3.8    ------------------------------------------------------------------------------------------------------------------ Recent Labs    05/09/24 0408  CHOL 145  HDL 61  LDLCALC 66  TRIG 88  CHOLHDL 2.4    No results found for: HGBA1C ------------------------------------------------------------------------------------------------------------------ No results for input(s): TSH, T4TOTAL, T3FREE, THYROIDAB in the last 72 hours.  Invalid input(s): FREET3  Cardiac Enzymes No results for input(s): CKMB, TROPONINI, MYOGLOBIN in the last 168 hours.  Invalid input(s): CK ------------------------------------------------------------------------------------------------------------------ No results found for: BNP  CBG: No results for input(s): GLUCAP in the last 168 hours.  No results found for this or any previous visit (from the past 240 hours).   Radiology Studies: CT Head Wo Contrast Result Date: 05/09/2024 CLINICAL DATA:  73 year old male with right PCA territory infarct  with hemorrhagic conversion. EXAM: CT HEAD WITHOUT CONTRAST TECHNIQUE: Contiguous axial images were obtained from the base of the skull through the vertex without intravenous contrast. RADIATION DOSE REDUCTION: This exam was performed according to the departmental dose-optimization program which includes automated exposure control, adjustment of the mA and/or kV according to patient size and/or use of iterative reconstruction technique. COMPARISON:  Brain MRI and head CT yesterday. FINDINGS: Brain: Combined cytotoxic edema and hyperdense blood in the right PCA territory redemonstrated, configuration not significantly changed from the MRI 1558 hours yesterday. This more resembles confluent petechial hemorrhage than malignant hemorrhagic transformation, as there is little regional mass effect and no progression. Gray-white differentiation elsewhere stable and within normal limits. No midline shift. Normal basilar cisterns. Vascular: Calcified atherosclerosis at the skull base. No suspicious intracranial vascular hyperdensity. Skull: Stable and intact. Sinuses/Orbits: Visualized paranasal sinuses and mastoids are stable and well aerated. Other: Visualized orbits and scalp soft tissues are within normal limits. IMPRESSION: 1. Stable Right PCA infarct with confluent petechial hemorrhage. No progression or regional mass effect to suggest a malignant hemorrhagic transformation. 2. No new intracranial abnormality. Electronically Signed   By: VEAR Hurst M.D.   On: 05/09/2024  05:14   CT Head Wo Contrast Result Date: 05/08/2024 CLINICAL DATA:  Loss of vision, known PCA infarct with hemorrhagic conversion EXAM: CT HEAD WITHOUT CONTRAST TECHNIQUE: Contiguous axial images were obtained from the base of the skull through the vertex without intravenous contrast. RADIATION DOSE REDUCTION: This exam was performed according to the departmental dose-optimization program which includes automated exposure control, adjustment of the mA  and/or kV according to patient size and/or use of iterative reconstruction technique. COMPARISON:  MRI 05/08/2024, CT brain 08/27/2019 FINDINGS: Brain: Hypodense edema with local mass effect within the right occipital lobe and right posterior temporal lobe as seen on prior MRI imaging. Focal hemorrhage within the area of edema measuring approximately 2.7 x 1.7 x 2.1 cm (estimated volume of 4.8 mL). Small volume intraventricular blood within the posterior right lateral ventricle. No midline shift. Basilar cisterns remain patent. The ventricles are nonenlarged. Mild white matter hypodensity likely chronic small vessel ischemic change. Vascular: No hyperdense vessels.  Carotid vascular calcification Skull: Normal. Negative for fracture or focal lesion. Sinuses/Orbits: No acute finding. Other: None IMPRESSION: Hypodense edema with local mass effect within the right occipital lobe and right posterior temporal lobe as seen on prior MRI imaging and corresponding to history of acute right PCA infarct. Positive for hemorrhagic conversion with approximate 2.7 cm hematoma in the region of hypodense edema. Small volume intraventricular blood within the right lateral ventricle. Mild chronic small vessel ischemic changes of the white matter. Critical test finding documented on the previously performed MRI report. Electronically Signed   By: Luke Bun M.D.   On: 05/08/2024 17:46   MR BRAIN WO CONTRAST Addendum Date: 05/08/2024 ** ADDENDUM #1 ** EXAM: MRI BRAIN WITHOUT CONTRAST 05/08/2024 04:11:40 PM TECHNIQUE: Multiplanar multisequence MRI of the head/brain was performed without the administration of intravenous contrast. COMPARISON: Head CT dated 08/27/2019. CLINICAL HISTORY: Homonomous upper left quadrantanopsia. Patient complains of visual field deficit. Patient woke up Thursday morning and noted a visual field deficit in both eyes, primarily the left eye. Was seen by an eye doctor today, retina/optic nerve exam was  normal but he was found to have homonymous upper left quadrantanopsia, was instructed to come to the emergency department for additional testing. He reports a left-sided occipital headache as well. FINDINGS: BRAIN AND VENTRICLES: Restriction diffusion throughout the right PCA (posterior cerebral artery) territory particularly within the cortex and subcortical white matter of the right occipital lobe. Associated edema and local mass effect. Within this region there is an area of prominent susceptibility and signal abnormality measuring approximately 2.4 cm in size concerning for parenchymal hematoma suggestive of hemorrhagic conversion of infarct. No midline shift. Scattered areas of T2/FLAIR hyperintensity in the periventricular and subcortical white matter suggestive of mild chronic microvascular ischemic changes. Mild parenchymal volume loss. ORBITS: No acute abnormality. SINUSES AND MASTOIDS: No acute abnormality. BONES AND SOFT TISSUES: Normal marrow signal. No acute soft tissue abnormality. IMPRESSION: 1. Acute right PCA territory infarct with associated edema, local mass effect, and parenchymal hematoma (hemorrhagic conversion). No midline shift. Consider CT head for further evaluation of hemorrhage. 2. Mild chronic microvascular ischemic changes and mild parenchymal volume loss. 3. Discussed with Dr. Glendia at 4:52 PM on 05/08/24. Electronically signed by: Donnice Mania MD 05/08/2024 05:13 PM EDT RP Workstation: [id number]   Result Date: 05/08/2024 ** ORIGINAL REPORT ** EXAM: MRI BRAIN WITHOUT CONTRAST 05/08/2024 04:11:40 PM TECHNIQUE: Multiplanar multisequence MRI of the head/brain was performed without the administration of intravenous contrast. COMPARISON: Head CT dated 08/27/2019. CLINICAL HISTORY: Homonomous  upper left quadrantanopsia. Patient complains of visual field deficit. Patient woke up Thursday morning and noted a visual field deficit in both eyes, primarily the left eye. Was seen by an eye doctor  today, retina/optic nerve exam was normal but he was found to have homonymous upper left quadrantanopsia, was instructed to come to the emergency department for additional testing. He reports a left-sided occipital headache as well. FINDINGS: BRAIN AND VENTRICLES: Restriction diffusion throughout the right PCA (posterior cerebral artery) territory particularly within the cortex and subcortical white matter of the right occipital lobe. Associated edema and local mass effect. Within this region there is an area of prominent susceptibility and signal abnormality measuring approximately 2.4 cm in size concerning for parenchymal hematoma suggestive of hemorrhagic conversion of infarct. No midline shift. Scattered areas of T2/FLAIR hyperintensity in the periventricular and subcortical white matter suggestive of mild chronic microvascular ischemic changes. Mild parenchymal volume loss. ORBITS: No acute abnormality. SINUSES AND MASTOIDS: No acute abnormality. BONES AND SOFT TISSUES: Normal marrow signal. No acute soft tissue abnormality. IMPRESSION: 1. Acute right PCA territory infarct with associated edema, local mass effect, and parenchymal hematoma (hemorrhagic conversion). No midline shift. Consider CT head for further evaluation of hemorrhage. 2. Mild chronic microvascular ischemic changes and mild parenchymal volume loss. Electronically signed by: Donnice Mania MD 05/08/2024 04:45 PM EDT RP Workstation: [id number]   MR ANGIO HEAD WO CONTRAST Result Date: 05/08/2024 CLINICAL DATA:  Provided history: Homonymous upper left quadrantanopia. Brain MRI performed earlier today EXAM: MRA HEAD WITHOUT CONTRAST TECHNIQUE: Angiographic images of the Circle of Willis were acquired using MRA technique without intravenous contrast. COMPARISON:  Same-day brain MRI 05/08/2024.  Head CT 08/27/2019. FINDINGS: Anterior circulation: The intracranial internal carotid arteries are patent. The M1 middle cerebral arteries are patent. No M2  proximal branch occlusion or high-grade proximal stenosis. The anterior cerebral arteries are patent. No intracranial aneurysm is identified. Posterior circulation: The intracranial vertebral arteries are patent. The basilar artery is patent. The posterior cerebral arteries are patent proximally without high-grade stenosis. Posterior communicating arteries are diminutive or absent, bilaterally. Anatomic variants: As described. IMPRESSION: 1. No proximal intracranial large vessel occlusion or high-grade proximal arterial stenosis identified. 2. Brain MRI findings separately reported. Electronically Signed   By: Rockey Childs D.O.   On: 05/08/2024 16:57     Nilda Fendt, MD, PhD [facility]  Between 7 am - 7 pm I am available, please contact me via Amion (for emergencies) or Securechat (non urgent messages)  Between 7 pm - 7 am I am not available, please contact night coverage MD/APP via Amion

## 2024-05-09 NOTE — Progress Notes (Signed)
 Received pt from ED via wheelchair, Aox4, follows command, oriented to room and surroundings, placed on tele monitoring, keep call bell within reach.

## 2024-05-09 NOTE — Progress Notes (Signed)
 Pt off for CT scan via wheelchair.

## 2024-05-09 NOTE — Evaluation (Signed)
 Physical Therapy Evaluation & Discharge Patient Details Name: Gordon Jacobs MRN: [id number] DOB: 03-25-1951 Today's Date: 05/09/2024  History of Present Illness  73 y.o. male admitted 05/08/24 with headache, decreased peripheral and blurred vision, referred to ED from ophthalmologist. 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 Repeat head CT 8/2 shows no new acute abnormality from initial imaging. PMH includes HLD, vertigo.   Clinical Impression  Patient evaluated by Physical Therapy with no further acute PT needs identified. PTA, pt independent, active, works as Biomedical scientist, lives with wife. Today, pt independent with mobility and and higher level balance tasks; demonstrates some persistent peripheral vision deficits (L>R) though improved since admission. All education has been completed and the patient has no further questions. Acute PT is signing off. Thank you for this referral.    Post-ambulation BP 163/93 (MAP 114)     If plan is discharge home, recommend the following: Assist for transportation   Can travel by private vehicle    Yes    Equipment Recommendations None recommended by PT  Recommendations for Other Services   Occupational Therapist    Functional Status Assessment       Precautions / Restrictions Precautions Precautions: Other (comment) Recall of Precautions/Restrictions: Intact Precaution/Restrictions Comments: still has some peripheral vision deficits (L>R) Restrictions Weight Bearing Restrictions Per Provider Order: No      Mobility  Bed Mobility Overal bed mobility: Independent                  Transfers Overall transfer level: Independent                      Ambulation/Gait Ambulation/Gait assistance: Independent Gait Distance (Feet): 800 Feet Assistive device: None Gait Pattern/deviations: Step-through pattern, Decreased stride length        General Gait Details: no overt instability or LOB with higher level balance tasks; 1x running into doorway on L-side when head turned to R during conversation, pt able to self-correct with no LOB  Stairs Stairs: Yes Stairs assistance: Independent Stair Management: No rails, One rail Right, Alternating pattern, Forwards Number of Stairs: 5    Wheelchair Mobility     Tilt Bed    Modified Rankin (Stroke Patients Only) Modified Rankin (Stroke Patients Only) Pre-Morbid Rankin Score: No symptoms Modified Rankin: No significant disability     Balance Overall balance assessment: Independent Sitting-balance support: No upper extremity supported, Feet supported Sitting balance-Leahy Scale: Normal     Standing balance support: No upper extremity supported, During functional activity Standing balance-Leahy Scale: Good                   Standardized Balance Assessment Standardized Balance Assessment : Dynamic Gait Index   Dynamic Gait Index Level Surface: Normal Change in Gait Speed: Normal Gait with Horizontal Head Turns: Normal Gait with Vertical Head Turns: Normal Gait and Pivot Turn: Normal Step Over Obstacle: Mild Impairment Step Around Obstacles: Normal Steps: Normal Total Score: 23       Pertinent Vitals/Pain Pain Assessment Pain Assessment: Faces Faces Pain Scale: Hurts a little bit Pain Location: headache Pain Descriptors / Indicators: Headache Pain Intervention(s): Monitored during session    Home Living Family/patient expects to be discharged to:: Private residence Living Arrangements: Spouse/significant other Available Help at Discharge: Family;Available 24 hours/day Type of Home: House Home Access: Stairs to enter Entrance Stairs-Rails: None (post on R-side if needed for UE support) Entrance Stairs-Number  of Steps: 2   Home Layout: One level        Prior Function Prior Level of Function : Independent/Modified  Independent;Working/employed;Driving             Mobility Comments: indep without DME, enjoys walking and reading, 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 [employer] Comments: independent, enjoys cooking with wife     Extremity/Trunk Assessment   Upper Extremity Assessment Upper Extremity Assessment: Overall WFL for tasks assessed    Lower Extremity Assessment Lower Extremity Assessment: Overall WFL for tasks assessed    Cervical / Trunk Assessment Cervical / Trunk Assessment: Normal  Communication   Communication Communication: No apparent difficulties    Cognition Arousal: Alert Behavior During Therapy: WFL for tasks assessed/performed   PT - Cognitive impairments: No apparent impairments                         Following commands: Intact       Cueing       General Comments General comments (skin integrity, edema, etc.): educ on visual scanning strategies given peripheral vision deficits    Exercises     Assessment/Plan    PT Assessment Patient does not need any further PT services  PT Problem List         PT Treatment Interventions      PT Goals (Current goals can be found in the Care Plan section)  Acute Rehab PT Goals PT Goal Formulation: All assessment and education complete, DC therapy    Frequency       Co-evaluation               AM-PAC PT 6 Clicks Mobility  Outcome Measure Help needed turning from your back to your side while in a flat bed without using bedrails?: None Help needed moving from lying on your back to sitting on the side of a flat bed without using bedrails?: None Help needed moving to and from a bed to a chair (including a wheelchair)?: None Help needed standing up from a chair using your arms (e.g., wheelchair or bedside chair)?: None Help needed to walk in hospital room?: None Help needed climbing 3-5 steps with a railing? : None 6 Click Score: 24    End of Session    Activity Tolerance: Patient tolerated treatment well Patient left: in bed;with call bell/phone within reach;with nursing/sitter in room Nurse Communication: Mobility status PT Visit Diagnosis: Other symptoms and signs involving the nervous system (R29.898)    Time: 9091-9076 PT Time Calculation (min) (ACUTE ONLY): 15 min   Charges:   PT Evaluation $PT Eval Low Complexity: 1 Low   PT General Charges $$ ACUTE PT VISIT: 1 Visit       Darice Almas, PT, DPT Acute Rehabilitation Services  Personal: Secure Chat Rehab Office: [phone]  Darice LITTIE Almas 05/09/2024, 10:00 AM

## 2024-05-09 NOTE — Plan of Care (Signed)
  Problem: Education: Goal: Knowledge of disease or condition will improve Outcome: Progressing   Problem: Ischemic Stroke/TIA Tissue Perfusion: Goal: Complications of ischemic stroke/TIA will be minimized Outcome: Progressing   Problem: Health Behavior/Discharge Planning: Goal: Ability to manage health-related needs will improve Outcome: Progressing    Problem: Education: Goal: Knowledge of General Education information will improve Description: Including pain rating scale, medication(s)/side effects and non-pharmacologic comfort measures Outcome: Progressing   Problem: Clinical Measurements: Goal: Will remain free from infection Outcome: Progressing   Problem: Pain Managment: Goal: General experience of comfort will improve and/or be controlled Outcome: Progressing   Problem: Safety: Goal: Ability to remain free from injury will improve Outcome: Progressing

## 2024-05-10 ENCOUNTER — Inpatient Hospital Stay (HOSPITAL_COMMUNITY)

## 2024-05-10 ENCOUNTER — Other Ambulatory Visit: Payer: Self-pay | Admitting: Student

## 2024-05-10 DIAGNOSIS — G936 Cerebral edema: Secondary | ICD-10-CM

## 2024-05-10 DIAGNOSIS — I619 Nontraumatic intracerebral hemorrhage, unspecified: Secondary | ICD-10-CM | POA: Diagnosis not present

## 2024-05-10 DIAGNOSIS — R29702 NIHSS score 2: Secondary | ICD-10-CM | POA: Diagnosis not present

## 2024-05-10 DIAGNOSIS — I63531 Cerebral infarction due to unspecified occlusion or stenosis of right posterior cerebral artery: Secondary | ICD-10-CM | POA: Diagnosis not present

## 2024-05-10 DIAGNOSIS — I639 Cerebral infarction, unspecified: Secondary | ICD-10-CM | POA: Diagnosis not present

## 2024-05-10 MED ORDER — MECLIZINE HCL 12.5 MG PO TABS: 12.5000 mg | ORAL_TABLET | Freq: Three times a day (TID) | ORAL | 0 refills | Status: AC | PRN

## 2024-05-10 MED ORDER — AMLODIPINE BESYLATE 5 MG PO TABS: 5.0000 mg | ORAL_TABLET | Freq: Every day | ORAL | 0 refills | Status: AC

## 2024-05-10 NOTE — Progress Notes (Signed)
 Venous duplex lower ext  has been completed. Refer to [facility] under chart review to view preliminary results.   05/10/2024  1:58 PM Artemus Romanoff, Ricka BIRCH

## 2024-05-10 NOTE — Plan of Care (Signed)
 Problem: Education: Goal: Knowledge of disease or condition will improve 05/10/2024 1525 by Bernardine Nelwyn DASEN, RN Outcome: Completed/Met 05/10/2024 1524 by Bernardine Nelwyn DASEN, RN Outcome: Progressing 05/10/2024 1520 by Bernardine Nelwyn DASEN, RN Outcome: Progressing Goal: Knowledge of secondary prevention will improve (MUST DOCUMENT ALL) 05/10/2024 1525 by Bernardine Nelwyn DASEN, RN Outcome: Completed/Met 05/10/2024 1524 by Bernardine Nelwyn DASEN, RN Outcome: Progressing 05/10/2024 1520 by Bernardine Nelwyn DASEN, RN Outcome: Progressing Goal: Knowledge of patient specific risk factors will improve (DELETE if not current risk factor) 05/10/2024 1525 by Bernardine Nelwyn DASEN, RN Outcome: Completed/Met 05/10/2024 1524 by Bernardine Nelwyn DASEN, RN Outcome: Progressing 05/10/2024 1520 by Bernardine Nelwyn DASEN, RN Outcome: Progressing   Problem: Ischemic Stroke/TIA Tissue Perfusion: Goal: Complications of ischemic stroke/TIA will be minimized 05/10/2024 1525 by Bernardine Nelwyn DASEN, RN Outcome: Completed/Met 05/10/2024 1524 by Bernardine Nelwyn DASEN, RN Outcome: Progressing 05/10/2024 1520 by Bernardine Nelwyn DASEN, RN Outcome: Progressing   Problem: Coping: Goal: Will verbalize positive feelings about self 05/10/2024 1525 by Bernardine Nelwyn DASEN, RN Outcome: Completed/Met 05/10/2024 1524 by Bernardine Nelwyn DASEN, RN Outcome: Progressing 05/10/2024 1520 by Bernardine Nelwyn DASEN, RN Outcome: Progressing Goal: Will identify appropriate support needs 05/10/2024 1525 by Bernardine Nelwyn DASEN, RN Outcome: Completed/Met 05/10/2024 1524 by Bernardine Nelwyn DASEN, RN Outcome: Progressing 05/10/2024 1520 by Bernardine Nelwyn DASEN, RN Outcome: Progressing   Problem: Health Behavior/Discharge Planning: Goal: Ability to manage health-related needs will improve 05/10/2024 1525 by Bernardine Nelwyn DASEN, RN Outcome: Completed/Met 05/10/2024 1524 by Bernardine Nelwyn DASEN, RN Outcome: Progressing 05/10/2024 1520 by Bernardine Nelwyn DASEN, RN Outcome: Progressing Goal: Goals will be collaboratively  established with patient/family 05/10/2024 1525 by Bernardine Nelwyn DASEN, RN Outcome: Completed/Met 05/10/2024 1524 by Bernardine Nelwyn DASEN, RN Outcome: Progressing 05/10/2024 1520 by Bernardine Nelwyn DASEN, RN Outcome: Progressing   Problem: Self-Care: Goal: Ability to participate in self-care as condition permits will improve 05/10/2024 1525 by Bernardine Nelwyn DASEN, RN Outcome: Completed/Met 05/10/2024 1524 by Bernardine Nelwyn DASEN, RN Outcome: Progressing 05/10/2024 1520 by Bernardine Nelwyn DASEN, RN Outcome: Progressing Goal: Verbalization of feelings and concerns over difficulty with self-care will improve 05/10/2024 1525 by Bernardine Nelwyn DASEN, RN Outcome: Completed/Met 05/10/2024 1524 by Bernardine Nelwyn DASEN, RN Outcome: Progressing 05/10/2024 1520 by Bernardine Nelwyn DASEN, RN Outcome: Progressing Goal: Ability to communicate needs accurately will improve 05/10/2024 1525 by Bernardine Nelwyn DASEN, RN Outcome: Completed/Met 05/10/2024 1524 by Bernardine Nelwyn DASEN, RN Outcome: Progressing 05/10/2024 1520 by Bernardine Nelwyn DASEN, RN Outcome: Progressing   Problem: Nutrition: Goal: Risk of aspiration will decrease 05/10/2024 1525 by Bernardine Nelwyn DASEN, RN Outcome: Completed/Met 05/10/2024 1524 by Bernardine Nelwyn DASEN, RN Outcome: Progressing 05/10/2024 1520 by Bernardine Nelwyn DASEN, RN Outcome: Progressing Goal: Dietary intake will improve 05/10/2024 1525 by Bernardine Nelwyn DASEN, RN Outcome: Completed/Met 05/10/2024 1524 by Bernardine Nelwyn DASEN, RN Outcome: Progressing 05/10/2024 1520 by Bernardine Nelwyn DASEN, RN Outcome: Progressing   Problem: Education: Goal: Knowledge of General Education information will improve Description: Including pain rating scale, medication(s)/side effects and non-pharmacologic comfort measures 05/10/2024 1525 by Bernardine Nelwyn DASEN, RN Outcome: Completed/Met 05/10/2024 1524 by Bernardine Nelwyn DASEN, RN Outcome: Progressing 05/10/2024 1520 by Bernardine Nelwyn DASEN, RN Outcome: Progressing   Problem: Health Behavior/Discharge Planning: Goal:  Ability to manage health-related needs will improve 05/10/2024 1525 by Bernardine Nelwyn DASEN, RN Outcome: Completed/Met 05/10/2024 1524 by Bernardine Nelwyn DASEN, RN Outcome: Progressing 05/10/2024 1520 by Bernardine Nelwyn DASEN, RN Outcome: Progressing   Problem: Clinical Measurements: Goal: Ability to maintain clinical measurements within normal limits will improve 05/10/2024  1525 by Bernardine Nelwyn DASEN, RN Outcome: Completed/Met 05/10/2024 1524 by Bernardine Nelwyn DASEN, RN Outcome: Progressing 05/10/2024 1520 by Bernardine Nelwyn DASEN, RN Outcome: Progressing Goal: Will remain free from infection 05/10/2024 1525 by Bernardine Nelwyn DASEN, RN Outcome: Completed/Met 05/10/2024 1524 by Bernardine Nelwyn DASEN, RN Outcome: Progressing 05/10/2024 1520 by Bernardine Nelwyn DASEN, RN Outcome: Progressing Goal: Diagnostic test results will improve 05/10/2024 1525 by Bernardine Nelwyn DASEN, RN Outcome: Completed/Met 05/10/2024 1524 by Bernardine Nelwyn DASEN, RN Outcome: Progressing 05/10/2024 1520 by Bernardine Nelwyn DASEN, RN Outcome: Progressing Goal: Respiratory complications will improve 05/10/2024 1525 by Bernardine Nelwyn DASEN, RN Outcome: Completed/Met 05/10/2024 1524 by Bernardine Nelwyn DASEN, RN Outcome: Progressing 05/10/2024 1520 by Bernardine Nelwyn DASEN, RN Outcome: Progressing Goal: Cardiovascular complication will be avoided 05/10/2024 1525 by Bernardine Nelwyn DASEN, RN Outcome: Completed/Met 05/10/2024 1524 by Bernardine Nelwyn DASEN, RN Outcome: Progressing 05/10/2024 1520 by Bernardine Nelwyn DASEN, RN Outcome: Progressing   Problem: Activity: Goal: Risk for activity intolerance will decrease 05/10/2024 1525 by Bernardine Nelwyn DASEN, RN Outcome: Completed/Met 05/10/2024 1524 by Bernardine Nelwyn DASEN, RN Outcome: Progressing 05/10/2024 1520 by Bernardine Nelwyn DASEN, RN Outcome: Progressing   Problem: Nutrition: Goal: Adequate nutrition will be maintained 05/10/2024 1525 by Bernardine Nelwyn DASEN, RN Outcome: Completed/Met 05/10/2024 1524 by Bernardine Nelwyn DASEN, RN Outcome: Progressing 05/10/2024  1520 by Bernardine Nelwyn DASEN, RN Outcome: Progressing   Problem: Coping: Goal: Level of anxiety will decrease 05/10/2024 1525 by Bernardine Nelwyn DASEN, RN Outcome: Completed/Met 05/10/2024 1524 by Bernardine Nelwyn DASEN, RN Outcome: Progressing 05/10/2024 1520 by Bernardine Nelwyn DASEN, RN Outcome: Progressing   Problem: Elimination: Goal: Will not experience complications related to bowel motility 05/10/2024 1525 by Bernardine Nelwyn DASEN, RN Outcome: Completed/Met 05/10/2024 1524 by Bernardine Nelwyn DASEN, RN Outcome: Progressing 05/10/2024 1520 by Bernardine Nelwyn DASEN, RN Outcome: Progressing Goal: Will not experience complications related to urinary retention 05/10/2024 1525 by Bernardine Nelwyn DASEN, RN Outcome: Completed/Met 05/10/2024 1524 by Bernardine Nelwyn DASEN, RN Outcome: Progressing 05/10/2024 1520 by Bernardine Nelwyn DASEN, RN Outcome: Progressing   Problem: Pain Managment: Goal: General experience of comfort will improve and/or be controlled 05/10/2024 1525 by Bernardine Nelwyn DASEN, RN Outcome: Completed/Met 05/10/2024 1524 by Bernardine Nelwyn DASEN, RN Outcome: Progressing 05/10/2024 1520 by Bernardine Nelwyn DASEN, RN Outcome: Progressing   Problem: Safety: Goal: Ability to remain free from injury will improve 05/10/2024 1525 by Bernardine Nelwyn DASEN, RN Outcome: Completed/Met 05/10/2024 1524 by Bernardine Nelwyn DASEN, RN Outcome: Progressing 05/10/2024 1520 by Bernardine Nelwyn DASEN, RN Outcome: Progressing   Problem: Skin Integrity: Goal: Risk for impaired skin integrity will decrease 05/10/2024 1525 by Bernardine Nelwyn DASEN, RN Outcome: Completed/Met 05/10/2024 1524 by Bernardine Nelwyn DASEN, RN Outcome: Progressing 05/10/2024 1520 by Bernardine Nelwyn DASEN, RN Outcome: Progressing

## 2024-05-10 NOTE — Progress Notes (Signed)
 Occupational Therapy Treatment Patient Details Name: Gordon Jacobs MRN: [id number] DOB: 18-May-1951 Today's Date: 05/10/2024   History of present illness 73 y.o. male admitted 05/08/24 with headache, decreased peripheral and blurred vision, referred to ED from ophthalmologist. 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 Repeat head CT 8/2 shows no new acute abnormality from initial imaging. PMH includes HLD, vertigo.   OT comments  Patient seen by OT to address vision and visual scanning to left to increase safety with self care and mobility. Patient able to demonstrate scanning techniques to left with increased time to locate some items on left at sink. Patient performed mobility in hallway and demonstrated good scanning to left and continues to state deficits in left peripheral. Patient was provided with activities, word search, and instructed in cancelling E activities to further address. Discharge recommendations continue to be appropriate.        If plan is discharge home, recommend the following:  Assist for transportation   Equipment Recommendations  None recommended by OT    Recommendations for Other Services      Precautions / Restrictions Precautions Precautions: Other (comment) Recall of Precautions/Restrictions: Intact Precaution/Restrictions Comments: still has some peripheral vision deficits (L>R) Restrictions Weight Bearing Restrictions Per Provider Order: No       Mobility Bed Mobility Overal bed mobility: Independent                  Transfers Overall transfer level: Independent                       Balance Overall balance assessment: Mild deficits observed, not formally tested                                         ADL either performed or assessed with clinical judgement   ADL Overall ADL's : Independent                                             Extremity/Trunk Assessment              Vision   Vision Assessment?: Yes Eye Alignment: Within Functional Limits Ocular Range of Motion: Within Functional Limits Alignment/Gaze Preference: Within Defined Limits Tracking/Visual Pursuits: Able to track stimulus in all quads without difficulty Visual Fields: Left visual field deficit Additional Comments: able to use compensation techniques to scan to left   Perception     Praxis     Communication Communication Communication: No apparent difficulties   Cognition Arousal: Alert Behavior During Therapy: WFL for tasks assessed/performed Cognition: No apparent impairments                               Following commands: Intact        Cueing   Cueing Techniques: Verbal cues  Exercises      Shoulder Instructions       General Comments education on visual scanning stratigies, provided word search and instructions on cancelling E activities    Pertinent Vitals/ Pain       Pain Assessment Pain Assessment: No/denies pain Pain Intervention(s): Monitored during session  Home Living  Prior Functioning/Environment              Frequency           Progress Toward Goals  OT Goals(current goals can now be found in the care plan section)  Progress towards OT goals: Progressing toward goals  Acute Rehab OT Goals Patient Stated Goal: to go home OT Goal Formulation: With patient Time For Goal Achievement: 05/23/24 Potential to Achieve Goals: Good ADL Goals Additional ADL Goal #1: Pt will demonstrate ind with visual compensation strategies and activities.  Plan      Co-evaluation                 AM-PAC OT 6 Clicks Daily Activity     Outcome Measure   Help from another person eating meals?: None Help from another person taking care of personal grooming?: None Help from another person  toileting, which includes using toliet, bedpan, or urinal?: None Help from another person bathing (including washing, rinsing, drying)?: None Help from another person to put on and taking off regular upper body clothing?: None Help from another person to put on and taking off regular lower body clothing?: None 6 Click Score: 24    End of Session    OT Visit Diagnosis: Low vision, both eyes (H54.2)   Activity Tolerance Patient tolerated treatment well   Patient Left in bed;with call bell/phone within reach   Nurse Communication Mobility status        Time: 8987-8961 OT Time Calculation (min): 26 min  Charges: OT General Charges $OT Visit: 1 Visit OT Treatments $Therapeutic Activity: 23-37 mins  Dick Laine, OTA Acute Rehabilitation Services  Office [phone]   Jeb LITTIE Laine 05/10/2024, 1:21 PM

## 2024-05-10 NOTE — Discharge Instructions (Signed)
 Follow with Gordon Leni Edyth DELENA, MD in 5-7 days  Please get a complete blood count and chemistry panel checked by your Primary MD at your next visit, and again as instructed by your Primary MD. Please get your medications reviewed and adjusted by your Primary MD.  Please request your Primary MD to go over all Hospital Tests and Procedure/Radiological results at the follow up, please get all Hospital records sent to your Prim MD by signing hospital release before you go home.  In some cases, there will be blood work, cultures and biopsy results pending at the time of your discharge. Please request that your primary care M.D. goes through all the records of your hospital data and follows up on these results.  If you had Pneumonia of Lung problems at the Hospital: Please get a 2 view Chest X ray done in 6-8 weeks after hospital discharge or sooner if instructed by your Primary MD.  If you have Congestive Heart Failure: Please call your Cardiologist or Primary MD anytime you have any of the following symptoms:  1) 3 pound weight gain in 24 hours or 5 pounds in 1 week  2) shortness of breath, with or without a dry hacking cough  3) swelling in the hands, feet or stomach  4) if you have to sleep on extra pillows at night in order to breathe  Follow cardiac low salt diet and 1.5 lit/day fluid restriction.  If you have diabetes Accuchecks 4 times/day, Once in AM empty stomach and then before each meal. Log in all results and show them to your primary doctor at your next visit. If any glucose reading is under 80 or above 300 call your primary MD immediately.  If you have Seizure/Convulsions/Epilepsy: Please do not drive, operate heavy machinery, participate in activities at heights or participate in high speed sports until you have seen by Primary MD or a Neurologist and advised to do so again. Per [place]  DMV statutes, patients with seizures are not allowed to drive until they have been  seizure-free for six months.  Use caution when using heavy equipment or power tools. Avoid working on ladders or at heights. Take showers instead of baths. Ensure the water temperature is not too high on the home water heater. Do not go swimming alone. Do not lock yourself in a room alone (i.e. bathroom). When caring for infants or small children, sit down when holding, feeding, or changing them to minimize risk of injury to the child in the event you have a seizure. Maintain good sleep hygiene. Avoid alcohol.   If you had Gastrointestinal Bleeding: Please ask your Primary MD to check a complete blood count within one week of discharge or at your next visit. Your endoscopic/colonoscopic biopsies that are pending at the time of discharge, will also need to followed by your Primary MD.  Get Medicines reviewed and adjusted. Please take all your medications with you for your next visit with your Primary MD  Please request your Primary MD to go over all hospital tests and procedure/radiological results at the follow up, please ask your Primary MD to get all Hospital records sent to his/her office.  If you experience worsening of your admission symptoms, develop shortness of breath, life threatening emergency, suicidal or homicidal thoughts you must seek medical attention immediately by calling 911 or calling your MD immediately  if symptoms less severe.  You must read complete instructions/literature along with all the possible adverse reactions/side effects for all the Medicines  you take and that have been prescribed to you. Take any new Medicines after you have completely understood and accpet all the possible adverse reactions/side effects.   Do not drive or operate heavy machinery when taking Pain medications.   Do not take more than prescribed Pain, Sleep and Anxiety Medications  Special Instructions: If you have smoked or chewed Tobacco  in the last 2 yrs please stop smoking, stop any regular  Alcohol  and or any Recreational drug use.  Wear Seat belts while driving.  Please note You were cared for by a hospitalist during your hospital stay. If you have any questions about your discharge medications or the care you received while you were in the hospital after you are discharged, you can call the unit and asked to speak with the hospitalist on call if the hospitalist that took care of you is not available. Once you are discharged, your primary care physician will handle any further medical issues. Please note that NO REFILLS for any discharge medications will be authorized once you are discharged, as it is imperative that you return to your primary care physician (or establish a relationship with a primary care physician if you do not have one) for your aftercare needs so that they can reassess your need for medications and monitor your lab values.  You can reach the hospitalist office at phone [phone] or fax [phone]   If you do not have a primary care physician, you can call [phone] for a physician referral.  Activity: As tolerated with Full fall precautions use walker/cane & assistance as needed    Diet: regular  Disposition Home

## 2024-05-10 NOTE — Progress Notes (Addendum)
 STROKE TEAM PROGRESS NOTE    INTERIM HISTORY/SUBJECTIVE No family at the bedside. No acute event overnight. Pt vision unchanged from yesterday. No DVT on LE venous doppler.   OBJECTIVE  CBC    Component Value Date/Time   WBC 7.7 05/09/2024 0408   RBC 4.62 05/09/2024 0408   HGB 13.5 05/09/2024 0408   HCT 40.8 05/09/2024 0408   PLT 197 05/09/2024 0408   MCV 88.3 05/09/2024 0408   MCH 29.2 05/09/2024 0408   MCHC 33.1 05/09/2024 0408   RDW 13.2 05/09/2024 0408    BMET    Component Value Date/Time   NA 138 05/08/2024 2104   K 3.6 05/08/2024 2104   CL 107 05/08/2024 2104   CO2 19 (L) 05/08/2024 2104   GLUCOSE 92 05/08/2024 2104   BUN 14 05/08/2024 2104   CREATININE 0.92 05/08/2024 2104   CALCIUM  8.6 (L) 05/08/2024 2104   GFRNONAA >60 05/08/2024 2104    IMAGING past 24 hours   Vitals:   05/09/24 2100 05/09/24 2342 05/10/24 0348 05/10/24 1121  BP: 129/83 133/89 (!) 136/94 (!) 157/138  Pulse: 79 82 86 87  Resp:  17 17 (!) 22  Temp:  98.2 F (36.8 C) 98.3 F (36.8 C) 98.8 F (37.1 C)  TempSrc: Oral Oral Oral Oral  SpO2: 96% 97% 97% 94%  Weight:      Height:         PHYSICAL EXAM General:  Alert, well-nourished, well-developed patient in no acute distress Psych:  Mood and affect appropriate for situation CV: Regular rate and rhythm on monitor Respiratory:  Regular, unlabored respirations on room air GI: Abdomen soft and nontender   NEURO:  Mental Status: AA&Ox3, patient is able to give clear and coherent history Speech/Language: speech is without dysarthria or aphasia.  Naming, repetition, fluency, and comprehension intact.  Cranial Nerves:  II: PERRL.  Left hemianopsia III, IV, VI: EOMI. Eyelids elevate symmetrically.  V: Sensation is intact to light touch and symmetrical to face.  VII: Face is symmetrical resting and smiling VIII: hearing intact to voice. IX, X: Palate elevates symmetrically. Phonation is normal.  KP:Dynloizm shrug 5/5. XII: tongue is  midline without fasciculations. Motor: 5/5 strength to all muscle groups tested.  Tone: is normal and bulk is normal Sensation- Intact to light touch bilaterally. Extinction absent to light touch to DSS.   Coordination: FTN intact bilaterally Gait- deferred  Most Recent NIH  1a Level of Conscious.: 0 1b LOC Questions: 0 1c LOC Commands: 0 2 Best Gaze: 0 3 Visual: 2 4 Facial Palsy: 0 5a Motor Arm - left: 0 5b Motor Arm - Right: 0 6a Motor Leg - Left: 0 6b Motor Leg - Right: 0 7 Limb Ataxia: 0 8 Sensory: 0 9 Best Language: 0 10 Dysarthria: 0 11 Extinct. and Inatten.: 0 TOTAL: 2   ASSESSMENT/PLAN  [name] is a 73 y.o. male with history of hyperlipidemia and vertigo admitted for acute onset left hemianopsia.  He was found on MRI to have a right occipital infarct with hemorrhagic conversion.  This was confirmed to be stable with repeat CT.  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 CT head acute right PCA territory infarct with hemorrhagic conversion and 2.7 cm hematoma Follow-up head CT stable right PCA territory infarct with confluent petechial hemorrhage CTA neck no LVO, generalized arterial tortuosity MRI acute right PCA territory infarct with associated edema, local mass effect and  parenchymal hematoma, mild chronic microvascular ischemic changes and mild parenchymal volume loss MRA no LVO or high-grade proximal arterial stenosis 2D Echo EF 55 to 60%, no atrial level shunt LE venous Doppler no DVT Pt prefer 30-day cardiac monitor first, if negative will consider Loop recorder LDL 66 HgbA1c pending VTE prophylaxis - SCDs No antithrombotic prior to admission, now on No antithrombotic for now given hemorrhagic conversion. Follow up at [facility] with Dr. Rosemarie in 2-3 weeks to consider antithrombotics Therapy recommendations:  Outpatient PT/OT/ST Disposition: Pending    Hypertension Home meds: None Stable  On Norvasc  10 Long-term BP goal normotensive  Hyperlipidemia Home meds: Atorvastatin  80 mg daily, resumed in hospital LDL 66, goal < 70 Continue statin at discharge  Other Stroke Risk Factors Advanced age  Other Active Problems No driving at this time, pt is aware, follow up with ophthalmology and driving rehab  Hospital day # 2  Neurology will sign off. Please call with questions. Pt will follow up with stroke clinic Dr. Rosemarie at [facility] in about 2-3 weeks. Thanks for the consult.   Ary Cummins, MD PhD Stroke Neurology 05/10/2024 4:14 PM   To contact Stroke Continuity provider, please refer to [email or web address]. After hours, contact General Neurology

## 2024-05-10 NOTE — Discharge Summary (Signed)
 Physician Discharge Summary  Gordon Jacobs FMW:[id number] DOB: Apr 30, 1951 DOA: 05/08/2024  PCP: Maree Leni Edyth DELENA, MD  Admit date: 05/08/2024 Discharge date: 05/10/2024  Admitted From: home Disposition:  home  Recommendations for Outpatient Follow-up:  Follow up with PCP in 1-2 weeks Follow-up with neurology as an outpatient  Home Health: none Equipment/Devices: none  Discharge Condition: stable CODE STATUS: Full code Diet Orders (From admission, onward)     Start     Ordered   05/08/24 1950  Diet Heart Room service appropriate? Yes; Fluid consistency: Thin  Diet effective now       Question Answer Comment  Room service appropriate? Yes   Fluid consistency: Thin      05/08/24 1949           Brief Narrative / Interim history: Gordon Jacobs is a 73 y.o. male with medical history significant of chronic vertigo, seborrheic keratosis, GERD, aortic atherosclerosis, essential hypertension, inguinal hernia, and hyperlipidemia has been referred from [facility] to emergency department for complaining of bilateral peripheral vision loss/blurriness that started Thursday morning 7/31   Hospital Course / Discharge diagnoses: Principal Problem:   Hemorrhagic stroke [facility]) Active Problems:   Hypertensive emergency   Blurry vision, bilateral   Essential hypertension   Chronic vertigo   Hyperlipidemia   Principal problem Acute CVA with hemorrhagic conversion -on admission, MRI of the brain showed acute right PCA territory infarction with edema with local mass effect and parenchymal hematoma, hemorrhagic conversion.  He did not have any midline shift.  MR angiogram head and neck without any large vessel occlusions.  Neurology consulted and followed patient while hospitalized.  CT angio head and neck without LVO.  2D echo showing LVEF 55-60%, normal function, grade 1 diastolic dysfunction.  RV size was normal..  Therapy recommends outpatient PT.  Lipid panel shows an LDL of 66.  A1c  pending. Lower extremity Dopplers for DVT were negative.  Clinically he has improved, vision is improving and will be discharged home in stable condition.  Advised not to drive until cleared as an outpatient   Active problems Hyperlipidemia-already on statin Elevated blood pressure-he tells me he does not have hypertension, donates blood every other month and he is always normotensive.  Blood pressure is a little bit higher here, has been placed on amlodipine  due to bleed and blood pressure has normalized.  Continue upon discharge  Sepsis ruled out   Discharge Instructions   Allergies as of 05/10/2024       Reactions   Strawberry (diagnostic) Anaphylaxis        Medication List     STOP taking these medications    ibuprofen 200 MG tablet Commonly known as: ADVIL       TAKE these medications    amLODipine  5 MG tablet Commonly known as: NORVASC  Take 1 tablet (5 mg total) by mouth daily. Start taking on: May 11, 2024   atorvastatin  80 MG tablet Commonly known as: LIPITOR  Take 80 mg by mouth at bedtime.   cetirizine 10 MG tablet Commonly known as: ZYRTEC Take 10 mg by mouth as needed for allergies.   meclizine  12.5 MG tablet Commonly known as: ANTIVERT  Take 1 tablet (12.5 mg total) by mouth 3 (three) times daily as needed for dizziness.   vitamin B-12 100 MCG tablet Commonly known as: CYANOCOBALAMIN Take 100 mcg by mouth in the morning.   VITAMIN C PO Take 1 tablet by mouth in the morning.   VITAMIN D3 PO Take 1 tablet  by mouth in the morning.       Consultations: Neurology   Procedures/Studies:  ECHOCARDIOGRAM COMPLETE Result Date: 05/09/2024    ECHOCARDIOGRAM REPORT   Patient Name:   Gordon Jacobs Date of Exam: 05/09/2024 Medical Rec #:  [id number]       Height:       75.0 in Accession #:    [id number]      Weight:       210.0 lb Date of Birth:  Oct 18, 1950       BSA:          2.240 m Patient Age:    73 years        BP:           148/103 mmHg Patient  Gender: M               HR:           84 bpm. Exam Location:  Inpatient Procedure: 2D Echo, Cardiac Doppler, Color Doppler and Intracardiac            Opacification Agent (Both Spectral and Color Flow Doppler were            utilized during procedure). Indications:    CVA  History:        Patient has no prior history of Echocardiogram examinations.                 Risk Factors:Hypertension and Dyslipidemia.  Sonographer:    Therisa Crouch Referring Phys: [id number] SUBRINA SUNDIL IMPRESSIONS  1. No LV thrombus by Definity . Left ventricular ejection fraction, by estimation, is 55 to 60%. The left ventricle has normal function. Left ventricular endocardial border not optimally defined to evaluate regional wall motion. Left ventricular diastolic parameters are consistent with Grade I diastolic dysfunction (impaired relaxation).  2. Right ventricular systolic function was not well visualized. The right ventricular size is normal. Tricuspid regurgitation signal is inadequate for assessing PA pressure.  3. The mitral valve is grossly normal. No evidence of mitral valve regurgitation. No evidence of mitral stenosis.  4. The aortic valve was not well visualized. Aortic valve regurgitation is not visualized. No aortic stenosis is present.  5. The inferior vena cava is normal in size with greater than 50% respiratory variability, suggesting right atrial pressure of 3 mmHg. Comparison(s): No prior Echocardiogram. FINDINGS  Left Ventricle: No LV thrombus by Definity . Left ventricular ejection fraction, by estimation, is 55 to 60%. The left ventricle has normal function. Left ventricular endocardial border not optimally defined to evaluate regional wall motion. Strain was performed and the global longitudinal strain is indeterminate. The left ventricular internal cavity size was normal in size. There is no left ventricular hypertrophy. Left ventricular diastolic parameters are consistent with Grade I diastolic dysfunction  (impaired  relaxation). Normal left ventricular filling pressure. Right Ventricle: The right ventricular size is normal. No increase in right ventricular wall thickness. Right ventricular systolic function was not well visualized. Tricuspid regurgitation signal is inadequate for assessing PA pressure. Left Atrium: Left atrial size was normal in size. Right Atrium: Right atrial size was normal in size. Pericardium: There is no evidence of pericardial effusion. Mitral Valve: The mitral valve is grossly normal. No evidence of mitral valve regurgitation. No evidence of mitral valve stenosis. Tricuspid Valve: The tricuspid valve is grossly normal. Tricuspid valve regurgitation is not demonstrated. No evidence of tricuspid stenosis. Aortic Valve: The aortic valve was not well visualized. Aortic valve regurgitation is not visualized. No aortic stenosis is  present. Pulmonic Valve: The pulmonic valve was not well visualized. Pulmonic valve regurgitation is not visualized. No evidence of pulmonic stenosis. Aorta: The aortic root is normal in size and structure. Venous: The inferior vena cava is normal in size with greater than 50% respiratory variability, suggesting right atrial pressure of 3 mmHg. IAS/Shunts: No atrial level shunt detected by color flow Doppler. Additional Comments: 3D was performed not requiring image post processing on an independent workstation and was indeterminate.  LEFT VENTRICLE PLAX 2D LVIDd:         5.00 cm   Diastology LVIDs:         3.50 cm   LV e' medial:    10.40 cm/s LV PW:         0.80 cm   LV E/e' medial:  3.9 LV IVS:        0.80 cm   LV e' lateral:   7.94 cm/s LVOT diam:     2.20 cm   LV E/e' lateral: 5.1 LVOT Area:     3.80 cm  RIGHT VENTRICLE             IVC RV Basal diam:  3.00 cm     IVC diam: 1.80 cm RV S prime:     11.60 cm/s TAPSE (M-mode): 3.4 cm LEFT ATRIUM             Index LA diam:        3.10 cm 1.38 cm/m LA Vol (A2C):   39.2 ml 17.50 ml/m LA Vol (A4C):   34.6 ml 15.44 ml/m LA Biplane  Vol: 37.7 ml 16.83 ml/m   AORTA Ao Root diam: 3.60 cm MITRAL VALVE MV Area (PHT): 3.33 cm    SHUNTS MV Decel Time: 228 msec    Systemic Diam: 2.20 cm MV E velocity: 40.40 cm/s MV A velocity: 75.50 cm/s MV E/A ratio:  0.54 Vishnu Priya Mallipeddi Electronically signed by Diannah Late Mallipeddi Signature Date/Time: 05/09/2024/11:26:11 AM    Final    CT ANGIO NECK W OR WO CONTRAST Result Date: 05/09/2024 CLINICAL DATA:  73 year old male with right PCA territory infarct with hemorrhagic conversion. EXAM: CT ANGIOGRAPHY NECK TECHNIQUE: Multidetector CT imaging of the neck was performed using the standard protocol during bolus administration of intravenous contrast. Multiplanar CT image reconstructions and MIPs were obtained to evaluate the vascular anatomy. Carotid stenosis measurements (when applicable) are obtained utilizing NASCET criteria, using the distal internal carotid diameter as the denominator. RADIATION DOSE REDUCTION: This exam was performed according to the departmental dose-optimization program which includes automated exposure control, adjustment of the mA and/or kV according to patient size and/or use of iterative reconstruction technique. CONTRAST:  75mL OMNIPAQUE  IOHEXOL  350 MG/ML SOLN COMPARISON:  Head CT 0442 hours today. FINDINGS: CTA NECK Skeleton: Widespread advanced cervical facet arthropathy. Chronic severe disc and endplate degeneration at C6-C7 with evidence of developing interbody ankylosis there. No acute osseous abnormality identified. Upper chest: Mild respiratory motion. Centrilobular emphysema is evident. Negative visible superior mediastinum. Other neck: Negative nonvascular neck soft tissue spaces. Aortic arch: Calcified aortic atherosclerosis.  3 vessel arch. Right carotid system: Brachiocephalic artery and right CCA patent with no significant plaque or stenosis. Minimal calcified plaque at the right ICA bulb. No stenosis. Left carotid system: Mild left CCA tortuosity and mild  plaque without stenosis. Mild soft and calcified plaque proximal left ICA without stenosis. Vertebral arteries: Right subclavian origin calcified plaque without stenosis. Normal right vertebral artery origin. Right vertebral artery patent and within normal limits  to the skull base. Proximal left subclavian artery calcified plaque without stenosis. Normal left vertebral artery origin. Tortuous left vertebral artery V1 and V2 segments. Left vertebral appears mildly dominant, patent to the skull base with no plaque or stenosis. LIMITED CTA HEAD Posterior circulation: Distal vertebral arteries and vertebrobasilar junction are patent with mild tortuosity, no plaque or stenosis. Normal left PICA origin. Right PICA and AICA diminutive or absent. Patent basilar artery without stenosis. Normal SCA and PCA origins. Posterior communicating arteries are diminutive or absent. Left PCA branches are normal. Right PCA branches also appear patent without significant stenosis through the proximal P3 segment. Anterior circulation: Both ICA siphons are patent with mild tortuosity. Mild siphon calcified plaque without stenosis. Patent carotid termini, MCA and ACA origins. Visible bilateral ACA and MCA branches are within normal limits. Anatomic variants: Mildly dominant left vertebral artery. Review of the MIP images confirms the above findings IMPRESSION: 1. Negative for large vessel occlusion. 2. Generalized arterial tortuosity but mild for age atherosclerosis in the head and neck. No arterial stenosis. Aortic Atherosclerosis (ICD10-I70.0) 3. Emphysema (ICD10-J43.9). Electronically Signed   By: VEAR Hurst M.D.   On: 05/09/2024 09:33   CT Head Wo Contrast Result Date: 05/09/2024 CLINICAL DATA:  73 year old male with right PCA territory infarct with hemorrhagic conversion. EXAM: CT HEAD WITHOUT CONTRAST TECHNIQUE: Contiguous axial images were obtained from the base of the skull through the vertex without intravenous contrast. RADIATION  DOSE REDUCTION: This exam was performed according to the departmental dose-optimization program which includes automated exposure control, adjustment of the mA and/or kV according to patient size and/or use of iterative reconstruction technique. COMPARISON:  Brain MRI and head CT yesterday. FINDINGS: Brain: Combined cytotoxic edema and hyperdense blood in the right PCA territory redemonstrated, configuration not significantly changed from the MRI 1558 hours yesterday. This more resembles confluent petechial hemorrhage than malignant hemorrhagic transformation, as there is little regional mass effect and no progression. Gray-white differentiation elsewhere stable and within normal limits. No midline shift. Normal basilar cisterns. Vascular: Calcified atherosclerosis at the skull base. No suspicious intracranial vascular hyperdensity. Skull: Stable and intact. Sinuses/Orbits: Visualized paranasal sinuses and mastoids are stable and well aerated. Other: Visualized orbits and scalp soft tissues are within normal limits. IMPRESSION: 1. Stable Right PCA infarct with confluent petechial hemorrhage. No progression or regional mass effect to suggest a malignant hemorrhagic transformation. 2. No new intracranial abnormality. Electronically Signed   By: VEAR Hurst M.D.   On: 05/09/2024 05:14   CT Head Wo Contrast Result Date: 05/08/2024 CLINICAL DATA:  Loss of vision, known PCA infarct with hemorrhagic conversion EXAM: CT HEAD WITHOUT CONTRAST TECHNIQUE: Contiguous axial images were obtained from the base of the skull through the vertex without intravenous contrast. RADIATION DOSE REDUCTION: This exam was performed according to the departmental dose-optimization program which includes automated exposure control, adjustment of the mA and/or kV according to patient size and/or use of iterative reconstruction technique. COMPARISON:  MRI 05/08/2024, CT brain 08/27/2019 FINDINGS: Brain: Hypodense edema with local mass effect within  the right occipital lobe and right posterior temporal lobe as seen on prior MRI imaging. Focal hemorrhage within the area of edema measuring approximately 2.7 x 1.7 x 2.1 cm (estimated volume of 4.8 mL). Small volume intraventricular blood within the posterior right lateral ventricle. No midline shift. Basilar cisterns remain patent. The ventricles are nonenlarged. Mild white matter hypodensity likely chronic small vessel ischemic change. Vascular: No hyperdense vessels.  Carotid vascular calcification Skull: Normal. Negative for fracture or  focal lesion. Sinuses/Orbits: No acute finding. Other: None IMPRESSION: Hypodense edema with local mass effect within the right occipital lobe and right posterior temporal lobe as seen on prior MRI imaging and corresponding to history of acute right PCA infarct. Positive for hemorrhagic conversion with approximate 2.7 cm hematoma in the region of hypodense edema. Small volume intraventricular blood within the right lateral ventricle. Mild chronic small vessel ischemic changes of the white matter. Critical test finding documented on the previously performed MRI report. Electronically Signed   By: Luke Bun M.D.   On: 05/08/2024 17:46   MR BRAIN WO CONTRAST Addendum Date: 05/08/2024 ** ADDENDUM #1 ** EXAM: MRI BRAIN WITHOUT CONTRAST 05/08/2024 04:11:40 PM TECHNIQUE: Multiplanar multisequence MRI of the head/brain was performed without the administration of intravenous contrast. COMPARISON: Head CT dated 08/27/2019. CLINICAL HISTORY: Homonomous upper left quadrantanopsia. Patient complains of visual field deficit. Patient woke up Thursday morning and noted a visual field deficit in both eyes, primarily the left eye. Was seen by an eye doctor today, retina/optic nerve exam was normal but he was found to have homonymous upper left quadrantanopsia, was instructed to come to the emergency department for additional testing. He reports a left-sided occipital headache as well.  FINDINGS: BRAIN AND VENTRICLES: Restriction diffusion throughout the right PCA (posterior cerebral artery) territory particularly within the cortex and subcortical white matter of the right occipital lobe. Associated edema and local mass effect. Within this region there is an area of prominent susceptibility and signal abnormality measuring approximately 2.4 cm in size concerning for parenchymal hematoma suggestive of hemorrhagic conversion of infarct. No midline shift. Scattered areas of T2/FLAIR hyperintensity in the periventricular and subcortical white matter suggestive of mild chronic microvascular ischemic changes. Mild parenchymal volume loss. ORBITS: No acute abnormality. SINUSES AND MASTOIDS: No acute abnormality. BONES AND SOFT TISSUES: Normal marrow signal. No acute soft tissue abnormality. IMPRESSION: 1. Acute right PCA territory infarct with associated edema, local mass effect, and parenchymal hematoma (hemorrhagic conversion). No midline shift. Consider CT head for further evaluation of hemorrhage. 2. Mild chronic microvascular ischemic changes and mild parenchymal volume loss. 3. Discussed with Dr. Glendia at 4:52 PM on 05/08/24. Electronically signed by: Donnice Mania MD 05/08/2024 05:13 PM EDT RP Workstation: [id number]   Result Date: 05/08/2024 ** ORIGINAL REPORT ** EXAM: MRI BRAIN WITHOUT CONTRAST 05/08/2024 04:11:40 PM TECHNIQUE: Multiplanar multisequence MRI of the head/brain was performed without the administration of intravenous contrast. COMPARISON: Head CT dated 08/27/2019. CLINICAL HISTORY: Homonomous upper left quadrantanopsia. Patient complains of visual field deficit. Patient woke up Thursday morning and noted a visual field deficit in both eyes, primarily the left eye. Was seen by an eye doctor today, retina/optic nerve exam was normal but he was found to have homonymous upper left quadrantanopsia, was instructed to come to the emergency department for additional testing. He reports a  left-sided occipital headache as well. FINDINGS: BRAIN AND VENTRICLES: Restriction diffusion throughout the right PCA (posterior cerebral artery) territory particularly within the cortex and subcortical white matter of the right occipital lobe. Associated edema and local mass effect. Within this region there is an area of prominent susceptibility and signal abnormality measuring approximately 2.4 cm in size concerning for parenchymal hematoma suggestive of hemorrhagic conversion of infarct. No midline shift. Scattered areas of T2/FLAIR hyperintensity in the periventricular and subcortical white matter suggestive of mild chronic microvascular ischemic changes. Mild parenchymal volume loss. ORBITS: No acute abnormality. SINUSES AND MASTOIDS: No acute abnormality. BONES AND SOFT TISSUES: Normal marrow signal. No acute  soft tissue abnormality. IMPRESSION: 1. Acute right PCA territory infarct with associated edema, local mass effect, and parenchymal hematoma (hemorrhagic conversion). No midline shift. Consider CT head for further evaluation of hemorrhage. 2. Mild chronic microvascular ischemic changes and mild parenchymal volume loss. Electronically signed by: Donnice Mania MD 05/08/2024 04:45 PM EDT RP Workstation: [id number]   MR ANGIO HEAD WO CONTRAST Result Date: 05/08/2024 CLINICAL DATA:  Provided history: Homonymous upper left quadrantanopia. Brain MRI performed earlier today EXAM: MRA HEAD WITHOUT CONTRAST TECHNIQUE: Angiographic images of the Circle of Willis were acquired using MRA technique without intravenous contrast. COMPARISON:  Same-day brain MRI 05/08/2024.  Head CT 08/27/2019. FINDINGS: Anterior circulation: The intracranial internal carotid arteries are patent. The M1 middle cerebral arteries are patent. No M2 proximal branch occlusion or high-grade proximal stenosis. The anterior cerebral arteries are patent. No intracranial aneurysm is identified. Posterior circulation: The intracranial vertebral  arteries are patent. The basilar artery is patent. The posterior cerebral arteries are patent proximally without high-grade stenosis. Posterior communicating arteries are diminutive or absent, bilaterally. Anatomic variants: As described. IMPRESSION: 1. No proximal intracranial large vessel occlusion or high-grade proximal arterial stenosis identified. 2. Brain MRI findings separately reported. Electronically Signed   By: Rockey Childs D.O.   On: 05/08/2024 16:57     Subjective: - no chest pain, shortness of breath, no abdominal pain, nausea or vomiting.   Discharge Exam: BP (!) 136/94 (BP Location: Left Arm)   Pulse 86   Temp 98.3 F (36.8 C) (Oral)   Resp 17   Ht 6' 3 (1.905 m)   Wt 95.3 kg   SpO2 97%   BMI 26.25 kg/m   General: Pt is alert, awake, not in acute distress Cardiovascular: RRR, S1/S2 +, no rubs, no gallops Respiratory: CTA bilaterally, no wheezing, no rhonchi Abdominal: Soft, NT, ND, bowel sounds + Extremities: no edema, no cyanosis    The results of significant diagnostics from this hospitalization (including imaging, microbiology, ancillary and laboratory) are listed below for reference.     Microbiology: No results found for this or any previous visit (from the past 240 hours).   Labs: Basic Metabolic Panel: Recent Labs  Lab 05/08/24 1409 05/08/24 2104  NA 136 138  K 4.0 3.6  CL 105 107  CO2 20* 19*  GLUCOSE 104* 92  BUN 17 14  CREATININE 0.94 0.92  CALCIUM  8.6* 8.6*   Liver Function Tests: Recent Labs  Lab 05/08/24 2104  AST 20  ALT 17  ALKPHOS 72  BILITOT 1.6*  PROT 6.2*  ALBUMIN 3.8   CBC: Recent Labs  Lab 05/08/24 1409 05/09/24 0408  WBC 9.1 7.7  HGB 14.1 13.5  HCT 42.9 40.8  MCV 88.5 88.3  PLT 216 197   CBG: No results for input(s): GLUCAP in the last 168 hours. Hgb A1c No results for input(s): HGBA1C in the last 72 hours. Lipid Profile Recent Labs    05/09/24 0408  CHOL 145  HDL 61  LDLCALC 66  TRIG 88   CHOLHDL 2.4   Thyroid function studies No results for input(s): TSH, T4TOTAL, T3FREE, THYROIDAB in the last 72 hours.  Invalid input(s): FREET3 Urinalysis No results found for: COLORURINE, APPEARANCEUR, LABSPEC, PHURINE, GLUCOSEU, HGBUR, BILIRUBINUR, KETONESUR, PROTEINUR, UROBILINOGEN, NITRITE, LEUKOCYTESUR  FURTHER DISCHARGE INSTRUCTIONS:   Get Medicines reviewed and adjusted: Please take all your medications with you for your next visit with your Primary MD   Laboratory/radiological data: Please request your Primary MD to go over all hospital tests and  procedure/radiological results at the follow up, please ask your Primary MD to get all Hospital records sent to his/her office.   In some cases, they will be blood work, cultures and biopsy results pending at the time of your discharge. Please request that your primary care M.D. goes through all the records of your hospital data and follows up on these results.   Also Note the following: If you experience worsening of your admission symptoms, develop shortness of breath, life threatening emergency, suicidal or homicidal thoughts you must seek medical attention immediately by calling 911 or calling your MD immediately  if symptoms less severe.   You must read complete instructions/literature along with all the possible adverse reactions/side effects for all the Medicines you take and that have been prescribed to you. Take any new Medicines after you have completely understood and accpet all the possible adverse reactions/side effects.    Do not drive when taking Pain medications or sleeping medications (Benzodaizepines)   Do not take more than prescribed Pain, Sleep and Anxiety Medications. It is not advisable to combine anxiety,sleep and pain medications without talking with your primary care practitioner   Special Instructions: If you have smoked or chewed Tobacco  in the last 2 yrs please stop  smoking, stop any regular Alcohol  and or any Recreational drug use.   Wear Seat belts while driving.   Please note: You were cared for by a hospitalist during your hospital stay. Once you are discharged, your primary care physician will handle any further medical issues. Please note that NO REFILLS for any discharge medications will be authorized once you are discharged, as it is imperative that you return to your primary care physician (or establish a relationship with a primary care physician if you do not have one) for your post hospital discharge needs so that they can reassess your need for medications and monitor your lab values.  Time coordinating discharge: 35 minutes  SIGNED:  Nilda Fendt, MD, PhD 05/10/2024, 11:11 AM

## 2024-05-10 NOTE — Plan of Care (Signed)
  Problem: Education: Goal: Knowledge of disease or condition will improve Outcome: Progressing Goal: Knowledge of secondary prevention will improve (MUST DOCUMENT ALL) Outcome: Progressing Goal: Knowledge of patient specific risk factors will improve (DELETE if not current risk factor) Outcome: Progressing   Problem: Ischemic Stroke/TIA Tissue Perfusion: Goal: Complications of ischemic stroke/TIA will be minimized Outcome: Progressing   Problem: Coping: Goal: Will verbalize positive feelings about self Outcome: Progressing Goal: Will identify appropriate support needs Outcome: Progressing   Problem: Health Behavior/Discharge Planning: Goal: Ability to manage health-related needs will improve Outcome: Progressing Goal: Goals will be collaboratively established with patient/family Outcome: Progressing   Problem: Self-Care: Goal: Ability to participate in self-care as condition permits will improve Outcome: Progressing Goal: Verbalization of feelings and concerns over difficulty with self-care will improve Outcome: Progressing Goal: Ability to communicate needs accurately will improve Outcome: Progressing   Problem: Nutrition: Goal: Risk of aspiration will decrease Outcome: Progressing Goal: Dietary intake will improve Outcome: Progressing   Problem: Education: Goal: Knowledge of General Education information will improve Description: Including pain rating scale, medication(s)/side effects and non-pharmacologic comfort measures Outcome: Progressing   Problem: Health Behavior/Discharge Planning: Goal: Ability to manage health-related needs will improve Outcome: Progressing   Problem: Clinical Measurements: Goal: Ability to maintain clinical measurements within normal limits will improve Outcome: Progressing Goal: Will remain free from infection Outcome: Progressing Goal: Diagnostic test results will improve Outcome: Progressing Goal: Respiratory complications will  improve Outcome: Progressing Goal: Cardiovascular complication will be avoided Outcome: Progressing   Problem: Nutrition: Goal: Adequate nutrition will be maintained Outcome: Progressing   Problem: Coping: Goal: Level of anxiety will decrease Outcome: Progressing   Problem: Elimination: Goal: Will not experience complications related to bowel motility Outcome: Progressing Goal: Will not experience complications related to urinary retention Outcome: Progressing   Problem: Pain Managment: Goal: General experience of comfort will improve and/or be controlled Outcome: Progressing   Problem: Safety: Goal: Ability to remain free from injury will improve Outcome: Progressing   Problem: Skin Integrity: Goal: Risk for impaired skin integrity will decrease Outcome: Progressing

## 2024-05-10 NOTE — Plan of Care (Signed)
  Problem: Ischemic Stroke/TIA Tissue Perfusion: Goal: Complications of ischemic stroke/TIA will be minimized Outcome: Progressing   Problem: Coping: Goal: Will verbalize positive feelings about self Outcome: Progressing   Problem: Health Behavior/Discharge Planning: Goal: Ability to manage health-related needs will improve Outcome: Progressing   Problem: Clinical Measurements: Goal: Will remain free from infection Outcome: Progressing   Problem: Nutrition: Goal: Adequate nutrition will be maintained Outcome: Progressing   Problem: Pain Managment: Goal: General experience of comfort will improve and/or be controlled Outcome: Progressing   Problem: Safety: Goal: Ability to remain free from injury will improve Outcome: Progressing

## 2024-05-10 NOTE — Care Management (Signed)
 Referral placed to [facility] for OP OT

## 2024-05-11 LAB — HEMOGLOBIN A1C
Hgb A1c MFr Bld: 5.4 % (ref 4.8–5.6)
Mean Plasma Glucose: 108 mg/dL

## 2024-05-13 ENCOUNTER — Inpatient Hospital Stay
Admission: RE | Admit: 2024-05-13 | Discharge: 2024-05-13 | Disposition: A | Discharge status: Home / Self Care | Source: Ambulatory Visit | Attending: Adult Health

## 2024-05-13 DIAGNOSIS — Z87891 Personal history of nicotine dependence: Secondary | ICD-10-CM

## 2024-05-13 DIAGNOSIS — Z122 Encounter for screening for malignant neoplasm of respiratory organs: Secondary | ICD-10-CM

## 2024-06-09 ENCOUNTER — Ambulatory Visit: Attending: Neurology | Admitting: Occupational Therapy

## 2024-06-09 ENCOUNTER — Encounter: Payer: Self-pay | Admitting: Occupational Therapy

## 2024-06-09 ENCOUNTER — Other Ambulatory Visit: Payer: Self-pay

## 2024-06-09 DIAGNOSIS — M6281 Muscle weakness (generalized): Secondary | ICD-10-CM | POA: Diagnosis present

## 2024-06-09 DIAGNOSIS — R29818 Other symptoms and signs involving the nervous system: Secondary | ICD-10-CM | POA: Diagnosis present

## 2024-06-09 DIAGNOSIS — R41842 Visuospatial deficit: Secondary | ICD-10-CM | POA: Diagnosis present

## 2024-06-09 NOTE — Therapy (Signed)
 OUTPATIENT OCCUPATIONAL THERAPY NEURO EVALUATION  Patient Name: Gordon Jacobs MRN: [id number] DOB:1950-10-28, 73 y.o., male Today's Date: 06/09/2024  PCP: Maree Leni Edyth DELENA, MD REFERRING PROVIDER: Trixie Nilda HERO, MD  END OF SESSION:  OT End of Session - 06/09/24 1358     Visit Number 1    Number of Visits 6    Date for OT Re-Evaluation 08/07/24    Authorization Type [employer] 2025    Authorization Time Period NO AUTH REQUIRED [id number]    OT Start Time 1400    OT Stop Time 1445    OT Time Calculation (min) 45 min    Equipment Utilized During Treatment Testing Material    Activity Tolerance Patient tolerated treatment well    Behavior During Therapy WFL for tasks assessed/performed          Past Medical History:  Diagnosis Date   Borderline hyperlipidemia    Cancer (HCC)    Frequent headaches    Peptic ulcer    Vertigo    Past Surgical History:  Procedure Laterality Date   INGUINAL HERNIA REPAIR     Patient Active Problem List   Diagnosis Date Noted   Hemorrhagic stroke (HCC) 05/08/2024   Hypertensive emergency 05/08/2024   Blurry vision, bilateral 05/08/2024   Essential hypertension 05/08/2024   Chronic vertigo 05/08/2024   Hyperlipidemia 05/08/2024    ONSET DATE: 05/10/2024  REFERRING DIAG: I63.9 (ICD-10-CM) - CVA (cerebral vascular accident)  THERAPY DIAG:  Visuospatial deficit  Other symptoms and signs involving the nervous system  Rationale for Evaluation and Treatment: Rehabilitation  SUBJECTIVE:   SUBJECTIVE STATEMENT: Pt reports he is here upon the recommendation from his doctor s/p visual changes due to a recent 05/07/24.  Pt reports he had OT in the hospital.    Pt reports he had significant loss of vision with concerns re: detached retina, therefore went to the ophthalmologist the next day and was sent to the ER due to concerns re: stroke.  He reported significant limited peripheral vision bilaterally (greater on L than R) ie) initially  90% deficit on L and 60% deficit on R.  He reports improvement up to 90% in R eye and 50% in L eye.  Pt drives a  2017 [facility] with blind spot detection system.  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  Pt has resumed driving - initially in his neighborhood but over the weekend he drove back from [place]  ~8 hours with his wife s/p vacation with 4 couples.  Pt reports working on multiple activities at home including scanning to see the numbers on the houses when he is taking a walk, scanning newspaper etc for specific words/letters, word searches, and jigsaw puzzles.  He reports daily/weekly improvements in his visual.  Pt accompanied by: self  PERTINENT HISTORY:   PMHx: chronic vertigo, seborrheic keratosis, GERD, aortic atherosclerosis, essential hypertension, inguinal hernia, and hyperlipidemia   Pt referred from [facility] to emergency department for complaining of bilateral peripheral vision loss/blurriness that started Thursday morning 7/31, along with headache  MRI: 1. Acute right PCA territory infarct with associated edema, local mass effect, and parenchymal hematoma (hemorrhagic conversion). No midline shift. Consider CT head for further evaluation of hemorrhage. 2. Mild chronic microvascular ischemic changes and mild parenchymal volume loss.  PRECAUTIONS: None  WEIGHT BEARING RESTRICTIONS: No  PAIN:  Are you having pain? No  FALLS: Has patient fallen in last 6 months? No  LIVING ENVIRONMENT: Lives with: lives with their spouse  Lives in: House Stairs: Yes: Internal: 12 steps; on left going up and External: 3 steps; none and but a column nearby on R side Has following equipment at home: Grab bars and  walk in shower with suction cup grab bar   PLOF: Independent - Pt was an Stage manager  PATIENT GOALS: Improve vision and resume [place] driving  OBJECTIVE:  Note: Objective measures were completed at Evaluation unless otherwise noted.  HAND DOMINANCE:  Right  ADLs: Overall ADLs: Pt reports good participation in ADLs without difficulty IADLs: Shopping: Ind Light housekeeping: Ind - 4 loads on Sunday s/p beach trip Meal Prep: Ind - cooked multiple meals on vacation Community mobility: Ind Medication management: Ind Landscape architect: Ind Handwriting: No changes  MOBILITY STATUS: Independent  POSTURE COMMENTS:  rounded shoulders and forward head Sitting balance: WNL  ACTIVITY TOLERANCE: Activity tolerance: Good  FUNCTIONAL OUTCOME MEASURES: PSFS - 7.0   UPPER EXTREMITY ROM:   WFL  UPPER EXTREMITY MMT:     MMT Right eval Left eval  Shoulder flexion 4+ 4-  Shoulder abduction    Shoulder adduction    Shoulder extension    Shoulder internal rotation    Shoulder external rotation    Middle trapezius    Lower trapezius    Elbow flexion 5 5  Elbow extension 5 5  Wrist flexion    Wrist extension    Wrist ulnar deviation    Wrist radial deviation    Wrist pronation    Wrist supination    (Blank rows = not tested)  HAND FUNCTION: Grip strength: Right: 71.6, 77.8, 76.4 lbs; Left: 82.8, 71.8, 78.9 lbs Average Right: 75.3 lbs Left: 77.8 lbs COORDINATION: No concerns from pt ie) no trouble with cutting vegetables etc this past weekend  SENSATION: WFL  EDEMA: NA  MUSCLE TONE: WFL  COGNITION: Overall cognitive status: Within functional limits for tasks assessed  VISION:  Subjective report: Pt reports using reading glasses x 10 + years. No double vision since stroke, just difficulty with peripheral vision.  Pt did report he has glasses for glare and that improved his vision dramatically - polarized. Baseline vision: Wears glasses for reading only Visual history: cataracts noted by MD upon visit for this vision issue 7/31,   VISION ASSESSMENT: Tracking/Visual pursuits: Able to track stimulus in all quads without difficulty Visual Fields: Right visual field deficits and Left visual field deficits right ~ 45*  > left ~ 30*  Patient has difficulty with following activities due to following visual impairments: peripheral vision, difficulty with laptop computer ie) in the lower quadrant of vision but not with TV on wall ie) no problems.  Per PSFS: difficulty with scanning in grocery store, seeing computer screen in lower quadrant and reading streat signs on posts ie) lower than the overhead signs at stop light level.  Hospital OT eval note:  Baseline Vision/History: 0 No visual deficits Patient Visual Report: Peripheral vision impairment Vision Assessment?: Yes Eye Alignment: Within Functional Limits Ocular Range of Motion: Within Functional Limits Alignment/Gaze Preference: Within Defined Limits Tracking/Visual Pursuits: Able to track stimulus in all quads without difficulty Visual Fields: Left visual field deficit Additional Comments: L eye L field cut, R eye with mild LLQ defiicit but more pronounced LUQ deficit.  TODAY'S TREATMENT:    - Self Care education and training completed for duration as noted below including: OT educated pt on rehabilitation process and results of objective measures in relation to pt specific goals. OT reviewed general vision recommendations including compensatory strategies including turning head to left to compensate for field cut as well as use of scanning to improve safety with functional mobility and accurate location of items to promote visual neuro rehabilitation following CVA.  Pt educated and encouraged to continue to practice tasks that he has been doing but also increasing the challenge ie) spreading puzzle pieces across a bigger surface for visual scanning.    PATIENT EDUCATION: Education details: OT role and POC Considerations and Visual Comp Strategies Person educated: Patient Education method: Explanation and Verbal cues Education  comprehension: verbalized understanding, verbal cues required, and needs further education  HOME EXERCISE PROGRAM: TBD   GOALS: Goals reviewed with patient? Yes  SHORT TERM GOALS: Target date: 07/03/24   Patient will independently recall at least 2 compensatory strategies and 2 AE options for visual impairment without cueing. Baseline: New to outpt OT  Goal status: IN Progress   2.  Patient will use head turn and scanning techniques to locate stimuli presented in the lower quadrants of a computer screen during a structured visual task (e.g., complete online game/puzzle, locating icons, reading text) with 80% accuracy.  Baseline: New to outpt OT  Goal status: INITIAL    LONG TERM GOALS: Target date: 08/07/24   1.  Patient will use visual scanning, magnification, or app-based assistance to identify items on grocery shelves at mid- to far-distance in 3/5 observed tasks. Baseline: New to outpt OT  Goal status: INITIAL  2. Patient will independently identify street signs located at or below eye level (e.g., stop signs, parking signs) from the sidewalk or vehicle in a simulated or real-world environment, improving safety in community navigation.  Baseline: New to outpt OT  Goal status: INITIAL   3.  Patient will report at least two-point increase in average PSFS score or at least three-point increase in a single activity score indicating functionally significant improvement given minimum detectable change. Baseline: 7.0 total score (See above for individual activity scores) Goal status: INITIAL  ASSESSMENT:  CLINICAL IMPRESSION: Patient is a 73 y.o. male who was seen today for occupational therapy evaluation for visual deficits s/p CVA. Hx includes veritgo and HTN. Patient currently presents near baseline level of function with ADLS but demonstrating functional deficits and impairments with peripheral vision as noted below. Pt would benefit from skilled OT services in the outpatient  setting to work on impairments as noted below to help pt return to PLOF as able.     PERFORMANCE DEFICITS: in functional skills including proprioception, sensation, decreased knowledge of precautions, decreased knowledge of use of DME, and vision, cognitive skills including problem solving and safety awareness, and psychosocial skills including coping strategies, environmental adaptation, and routines and behaviors.   IMPAIRMENTS: are limiting patient from work and social participation.   CO-MORBIDITIES: may have co-morbidities  that affects occupational performance. Patient will benefit from skilled OT to address above impairments and improve overall function.  MODIFICATION OR ASSISTANCE TO COMPLETE EVALUATION: Min-Moderate modification of tasks or assist with assess necessary to complete an evaluation.  OT OCCUPATIONAL PROFILE AND HISTORY: Detailed assessment: Review of records and additional review of physical, cognitive, psychosocial history related to current functional performance.  CLINICAL DECISION MAKING: Moderate - several treatment options, min-mod task modification necessary  REHAB POTENTIAL: Excellent  EVALUATION COMPLEXITY: Moderate    PLAN:  OT FREQUENCY: 1x/week  OT DURATION: 6 weeks  PLANNED INTERVENTIONS: 02831 OT Re-evaluation, 97535 self care/ADL training, 02469 therapeutic activity, visual/perceptual remediation/compensation, coping strategies training, patient/family education, and DME and/or AE instructions  RECOMMENDED OTHER SERVICES: NA  CONSULTED AND AGREED WITH PLAN OF CARE: Patient  PLAN FOR NEXT SESSION:  Visual Scanning (small bean bags) and with Passenger transport manager (table and computer) Visual comp strategies and equipment (driving options)  Clarita LITTIE Pride, OT 06/09/2024, 6:09 PM

## 2024-06-10 ENCOUNTER — Encounter: Payer: Self-pay | Admitting: Neurology

## 2024-06-10 ENCOUNTER — Ambulatory Visit: Payer: Self-pay | Admitting: Neurology

## 2024-06-10 VITALS — BP 138/78 | HR 74 | Ht 75.0 in | Wt 211.6 lb

## 2024-06-10 DIAGNOSIS — H5340 Unspecified visual field defects: Secondary | ICD-10-CM

## 2024-06-10 DIAGNOSIS — I63531 Cerebral infarction due to unspecified occlusion or stenosis of right posterior cerebral artery: Secondary | ICD-10-CM

## 2024-06-10 NOTE — Progress Notes (Unsigned)
 [name]  PATIENT: Gordon Jacobs DOB: 09-23-1951  REQUESTING CLINICIAN: Jerri Pfeiffer, MD HISTORY FROM: Patient  REASON FOR VISIT: Stroke follow up    HISTORICAL  CHIEF COMPLAINT:  Chief Complaint  Patient presents with   New Patient (Initial Visit)    Rm 13, alone. Hospital f/u-stroke. Right handed.     HISTORY OF PRESENT ILLNESS:  This is a 73 year old gentleman past medical history of hypertension, hyperlipidemia, vertigo who is presenting after being admitted to the hospital on August 1 for ischemic stroke.  Patient was referred to the ED by his ophthalmologist due to abnormal eye exam.  In the ED, he was noted to have a right parietal ischemic stroke with hemorrhagic transformation.  Stroke etiology likely large vessel disease but cannot rule out cardioembolic.  Due to the hemorrhage she was not started on antiplatelet.  He is currently wearing a cardiac monitor.  Tells me since discharge from the hospital he is doing better but still having left upper visual field cut.  He is currently undergoing occupational.  Denies any weakness from the stroke.   Brief Narrative / Interim history: Gordon Jacobs is a 73 y.o. male with medical history significant of chronic vertigo, seborrheic keratosis, GERD, aortic atherosclerosis, essential hypertension, inguinal hernia, and hyperlipidemia has been referred from [facility] to emergency department for complaining of bilateral peripheral vision loss/blurriness that started Thursday morning 7/31    Principal Problem:   Hemorrhagic stroke [facility]) Active Problems:   Hypertensive emergency   Blurry vision, bilateral   Essential hypertension   Chronic vertigo   Hyperlipidemia   Acute CVA with hemorrhagic conversion -on admission, MRI of the brain showed acute right PCA territory infarction with edema with local mass effect and parenchymal hematoma, hemorrhagic conversion.  He did not have any midline shift.  MR  angiogram head and neck without any large vessel occlusions.  Neurology consulted and followed patient while hospitalized.  CT angio head and neck without LVO.  2D echo showing LVEF 55-60%, normal function, grade 1 diastolic dysfunction.  RV size was normal..  Therapy recommends outpatient PT.  Lipid panel shows an LDL of 66.  A1c pending. Lower extremity Dopplers for DVT were negative.  Clinically he has improved, vision is improving and will be discharged home in stable condition.  Advised not to drive until cleared as an outpatient  OTHER MEDICAL CONDITIONS: Hypertension, Hyperlipidemia    REVIEW OF SYSTEMS: Full 14 system review of systems performed and negative with exception of: As noted in the HPI   ALLERGIES: Allergies  Allergen Reactions   Strawberry (Diagnostic) Anaphylaxis    HOME MEDICATIONS: Outpatient Medications Prior to Visit  Medication Sig Dispense Refill   amLODipine  (NORVASC ) 5 MG tablet Take 1 tablet (5 mg total) by mouth daily. 90 tablet 0   Ascorbic Acid (VITAMIN C PO) Take 1 tablet by mouth in the morning.     atorvastatin  (LIPITOR ) 80 MG tablet Take 80 mg by mouth at bedtime.     cetirizine (ZYRTEC) 10 MG tablet Take 10 mg by mouth as needed for allergies.     Cholecalciferol (VITAMIN D3 PO) Take 1 tablet by mouth in the morning.     meclizine  (ANTIVERT ) 12.5 MG tablet Take 1 tablet (12.5 mg total) by mouth 3 (three) times daily as needed for dizziness. 30 tablet 0   vitamin B-12 (CYANOCOBALAMIN) 100 MCG tablet Take 100 mcg by mouth in the morning.     vitamin k 100 MCG tablet Take 100  mcg by mouth daily.     No facility-administered medications prior to visit.    PAST MEDICAL HISTORY: Past Medical History:  Diagnosis Date   Borderline hyperlipidemia    Cancer (HCC)    Frequent headaches    Peptic ulcer    Vertigo     PAST SURGICAL HISTORY: Past Surgical History:  Procedure Laterality Date   INGUINAL HERNIA REPAIR      FAMILY HISTORY: Family  History  Problem Relation Age of Onset   Colon polyps Mother    Stomach cancer Mother    Liver cancer Mother    Ovarian cancer Mother    Breast cancer Sister    Colon polyps Brother    Colon cancer Neg Hx    Esophageal cancer Neg Hx    Rectal cancer Neg Hx     SOCIAL HISTORY: Social History   Socioeconomic History   Marital status: Married    Spouse name: Hadassah   Number of children: 1   Years of education: 14   Highest education level: Not on file  Occupational History   Not on file  Tobacco Use   Smoking status: Never   Smokeless tobacco: Never  Vaping Use   Vaping status: Never Used  Substance and Sexual Activity   Alcohol use: Yes    Comment: at most, 5  beers in a week   Drug use: Never   Sexual activity: Not on file  Other Topics Concern   Not on file  Social History Narrative   Coffee every morning   Right handed   Lives with wife, Hadassah   Social Drivers of Health   Financial Resource Strain: Not on file  Food Insecurity: No Food Insecurity (05/09/2024)   Hunger Vital Sign    Worried About Running Out of Food in the Last Year: Never true    Ran Out of Food in the Last Year: Never true  Transportation Needs: No Transportation Needs (05/09/2024)   PRAPARE - Administrator, Civil Service (Medical): No    Lack of Transportation (Non-Medical): No  Physical Activity: Not on file  Stress: Not on file  Social Connections: Socially Isolated (05/09/2024)   Social Connection and Isolation Panel    Frequency of Communication with Friends and Family: Never    Frequency of Social Gatherings with Friends and Family: Never    Attends Religious Services: Never    Database administrator or Organizations: No    Attends Banker Meetings: Never    Marital Status: Married  Catering manager Violence: Not At Risk (05/09/2024)   Humiliation, Afraid, Rape, and Kick questionnaire    Fear of Current or Ex-Partner: No    Emotionally Abused: No    Physically  Abused: No    Sexually Abused: No    PHYSICAL EXAM  GENERAL EXAM/CONSTITUTIONAL: Vitals:  Vitals:   06/10/24 1500  BP: 138/78  Pulse: 74  SpO2: 97%  Weight: 211 lb 9.6 oz (96 kg)  Height: 6' 3 (1.905 m)   Body mass index is 26.45 kg/m. Wt Readings from Last 3 Encounters:  06/10/24 211 lb 9.6 oz (96 kg)  05/08/24 210 lb (95.3 kg)  10/15/22 210 lb (95.3 kg)   Patient is in no distress; well developed, nourished and groomed; neck is supple  MUSCULOSKELETAL: Gait, strength, tone, movements noted in Neurologic exam below  NEUROLOGIC: MENTAL STATUS:      No data to display         awake,  alert, oriented to person, place and time recent and remote memory intact normal attention and concentration language fluent, comprehension intact, naming intact fund of knowledge appropriate  CRANIAL NERVE:  2nd, 3rd, 4th, 6th - There is a left visual field cut, left upper worse than left lower; extraocular muscles intact, no nystagmus 5th - facial sensation symmetric 7th - facial strength symmetric 8th - hearing intact 9th - palate elevates symmetrically, uvula midline 11th - shoulder shrug symmetric 12th - tongue protrusion midline  MOTOR:  normal bulk and tone, full strength in the BUE, BLE  SENSORY:  normal and symmetric to light touch  COORDINATION:  finger-nose-finger, fine finger movements normal  GAIT/STATION:  normal   DIAGNOSTIC DATA (LABS, IMAGING, TESTING) - I reviewed patient records, labs, notes, testing and imaging myself where available.  Lab Results  Component Value Date   WBC 7.7 05/09/2024   HGB 13.5 05/09/2024   HCT 40.8 05/09/2024   MCV 88.3 05/09/2024   PLT 197 05/09/2024      Component Value Date/Time   NA 138 05/08/2024 2104   K 3.6 05/08/2024 2104   CL 107 05/08/2024 2104   CO2 19 (L) 05/08/2024 2104   GLUCOSE 92 05/08/2024 2104   BUN 14 05/08/2024 2104   CREATININE 0.92 05/08/2024 2104   CALCIUM  8.6 (L) 05/08/2024 2104   PROT  6.2 (L) 05/08/2024 2104   ALBUMIN 3.8 05/08/2024 2104   AST 20 05/08/2024 2104   ALT 17 05/08/2024 2104   ALKPHOS 72 05/08/2024 2104   BILITOT 1.6 (H) 05/08/2024 2104   GFRNONAA >60 05/08/2024 2104   Lab Results  Component Value Date   CHOL 145 05/09/2024   HDL 61 05/09/2024   LDLCALC 66 05/09/2024   TRIG 88 05/09/2024   CHOLHDL 2.4 05/09/2024   Lab Results  Component Value Date   HGBA1C 5.4 05/08/2024   No results found for: VITAMINB12 No results found for: TSH  MRI Brain 05/08/2024 1. Acute right PCA territory infarct with associated edema, local mass effect, and parenchymal hematoma (hemorrhagic conversion). No midline shift. Consider CT head for further evaluation of hemorrhage. 2. Mild chronic microvascular ischemic changes and mild parenchymal volume loss  CTA Head and Neck 05/09/2024 1. Negative for large vessel occlusion. 2. Generalized arterial tortuosity but mild for age atherosclerosis in the head and neck. No arterial stenosis. Aortic Atherosclerosis (ICD10-I70.0) 3. Emphysema (ICD10-J43.9).  ASSESSMENT AND PLAN  73 y.o. year old male with history of hypertension, hyperlipidemia who is presenting following a right PCA territory stroke.  Stroke etiology likely large vessel disease but cannot rule out cardioembolic.  His angiogram did not show any atherosclerosis disease.  He is wearing a cardiac monitor and will follow-up with cardiology next week.  Plan will be to start patient on aspirin, it was not started in the hospital due to hemorrhage, we will continue with statin and I did advise him to follow-up with ophthalmologist to obtain a full visual field testing.  Continue with occupational therapy, will follow-up on cardiac monitor results and return in 1 year for follow-up or sooner if worse.   1. Cerebrovascular accident (CVA) due to occlusion of right posterior cerebral artery (HCC)   2. Visual field defect      Patient Instructions  Start aspirin 81 mg  daily daily Follow-up with cardiology regarding the cardiac monitor Will follow up cardiac monitor results Set up care with ophthalmologist, please obtain a full visual field testing Continue your other medications Continue to follow with PCP  Return in 1 year or sooner if worse.  No orders of the defined types were placed in this encounter.   No orders of the defined types were placed in this encounter.   Return in about 1 year (around 06/10/2025).    Pastor Falling, MD 06/11/2024, 1:01 PM  [name] [address], [address] [place], [place] [address] [phone]

## 2024-06-11 NOTE — Patient Instructions (Addendum)
 Start aspirin 81 mg daily daily Follow-up with cardiology regarding the cardiac monitor Will follow up cardiac monitor results Set up care with ophthalmologist, please obtain a full visual field testing Continue your other medications Continue to follow with PCP Return in 1 year or sooner if worse.

## 2024-06-15 ENCOUNTER — Ambulatory Visit: Attending: Student

## 2024-06-15 DIAGNOSIS — I639 Cerebral infarction, unspecified: Secondary | ICD-10-CM

## 2024-06-16 ENCOUNTER — Ambulatory Visit: Payer: Self-pay | Admitting: Student

## 2024-06-16 DIAGNOSIS — I639 Cerebral infarction, unspecified: Secondary | ICD-10-CM | POA: Diagnosis not present

## 2024-06-17 NOTE — Progress Notes (Signed)
 Thank you for the update!

## 2024-06-24 ENCOUNTER — Ambulatory Visit: Admitting: Occupational Therapy

## 2024-06-24 DIAGNOSIS — M6281 Muscle weakness (generalized): Secondary | ICD-10-CM

## 2024-06-24 DIAGNOSIS — R41842 Visuospatial deficit: Secondary | ICD-10-CM | POA: Diagnosis not present

## 2024-06-24 DIAGNOSIS — R29818 Other symptoms and signs involving the nervous system: Secondary | ICD-10-CM

## 2024-06-24 NOTE — Therapy (Signed)
 OUTPATIENT OCCUPATIONAL THERAPY NEURO TREATMENT  Patient Name: Gordon Jacobs MRN: [id number] DOB:[birth year], 73 y.o., male Today's Date: 06/24/2024  PCP: Maree Leni Edyth DELENA, MD REFERRING PROVIDER: Trixie Nilda HERO, MD  END OF SESSION:  OT End of Session - 06/24/24 1357     Visit Number 2    Number of Visits 6    Date for OT Re-Evaluation 08/07/24    Authorization Type [employer] 2025    Authorization Time Period NO AUTH REQUIRED [id number]    OT Start Time 1404    OT Stop Time 1505    OT Time Calculation (min) 61 min    Equipment Utilized During Treatment --    Activity Tolerance Patient tolerated treatment well    Behavior During Therapy WFL for tasks assessed/performed          Past Medical History:  Diagnosis Date   Borderline hyperlipidemia    Cancer (HCC)    Frequent headaches    Peptic ulcer    Vertigo    Past Surgical History:  Procedure Laterality Date   INGUINAL HERNIA REPAIR     Patient Active Problem List   Diagnosis Date Noted   Hemorrhagic stroke (HCC) 05/08/2024   Hypertensive emergency 05/08/2024   Blurry vision, bilateral 05/08/2024   Essential hypertension 05/08/2024   Chronic vertigo 05/08/2024   Hyperlipidemia 05/08/2024    ONSET DATE: 05/10/2024  REFERRING DIAG: I63.9 (ICD-10-CM) - CVA (cerebral vascular accident)  THERAPY DIAG:  Muscle weakness (generalized)  Other symptoms and signs involving the nervous system  Visuospatial deficit  Rationale for Evaluation and Treatment: Rehabilitation  SUBJECTIVE:   SUBJECTIVE STATEMENT: Patient is doing well. R peripheral vision is improving but L is still at about 50%. Had a recent MD visit and is doing well medically.Still doing puzzles (2,000 pieces) with family members and sometimes individually. Pt recognizes that the more fatigued he is the worse his vision is. He usually has to take a break from a task and then come back to it when this happens.   Pt accompanied by:  self  PERTINENT HISTORY:   PMHx: chronic vertigo, seborrheic keratosis, GERD, aortic atherosclerosis, essential hypertension, inguinal hernia, and hyperlipidemia   Pt referred from [facility] to emergency department for complaining of bilateral peripheral vision loss/blurriness that started Thursday morning 7/31, along with headache  MRI: 1. Acute right PCA territory infarct with associated edema, local mass effect, and parenchymal hematoma (hemorrhagic conversion). No midline shift. Consider CT head for further evaluation of hemorrhage. 2. Mild chronic microvascular ischemic changes and mild parenchymal volume loss.  PRECAUTIONS: None  WEIGHT BEARING RESTRICTIONS: No  PAIN:  Are you having pain? No  FALLS: Has patient fallen in last 6 months? 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  HAND DOMINANCE: Right  ADLs: Overall ADLs: Pt reports good participation in ADLs without difficulty IADLs: Shopping: Ind Light housekeeping: Ind - 4 loads on Sunday s/p beach trip Meal Prep: Ind - cooked multiple meals on vacation Community mobility: Ind Medication management: Ind Landscape architect: Ind Handwriting: No changes  MOBILITY STATUS: Independent  POSTURE COMMENTS:  rounded shoulders and forward head Sitting balance: WNL  ACTIVITY TOLERANCE: Activity tolerance: Good  FUNCTIONAL OUTCOME MEASURES: PSFS - 7.0   UPPER EXTREMITY ROM:   WFL  UPPER EXTREMITY MMT:     MMT Right eval Left eval  Shoulder flexion 4+ 4-  Shoulder abduction     Shoulder adduction    Shoulder extension    Shoulder internal rotation    Shoulder external rotation    Middle trapezius    Lower trapezius    Elbow flexion 5 5  Elbow extension 5 5  Wrist flexion    Wrist extension    Wrist ulnar deviation    Wrist radial deviation    Wrist pronation    Wrist supination    (Blank rows = not tested)  HAND FUNCTION: Grip strength: Right: 71.6, 77.8, 76.4 lbs; Left: 82.8, 71.8, 78.9 lbs Average Right: 75.3 lbs Left: 77.8 lbs COORDINATION: No concerns from pt ie) no trouble with cutting vegetables etc this past weekend  SENSATION: WFL  EDEMA: NA  MUSCLE TONE: WFL  COGNITION: Overall cognitive status: Within functional limits for tasks assessed  VISION:  Subjective report: Pt reports using reading glasses x 10 + years. No double vision since stroke, just difficulty with peripheral vision.  Pt did report he has glasses for glare and that improved his vision dramatically - polarized. Baseline vision: Wears glasses for reading only Visual history: cataracts noted by MD upon visit for this vision issue 7/31,   VISION ASSESSMENT: Tracking/Visual pursuits: Able to track stimulus in all quads without difficulty Visual Fields: Right visual field deficits and Left visual field deficits right ~ 45* > left ~ 30*  Patient has difficulty with following activities due to following visual impairments: peripheral vision, difficulty with laptop computer ie) in the lower quadrant of vision but not with TV on wall ie) no problems.  Per PSFS: difficulty with scanning in grocery store, seeing computer screen in lower quadrant and reading streat signs on posts ie) lower than the overhead signs at stop light level.  Hospital OT eval note:  Baseline Vision/History: 0 No visual deficits Patient Visual Report: Peripheral vision impairment Vision Assessment?: Yes Eye Alignment: Within Functional Limits Ocular Range of Motion: Within Functional  Limits Alignment/Gaze Preference: Within Defined Limits Tracking/Visual Pursuits: Able to track stimulus in all quads without difficulty Visual Fields: Left visual field deficit Additional Comments: L eye L field cut, R eye with mild LLQ defiicit but more pronounced LUQ deficit.                                                                                                                          TODAY'S TREATMENT:   - Therapeutic activities completed for duration as noted below including:   Pt engaged in one game of Golf Solitaire to target visual scanning, sustained attention, and problem solving.  Pt also engaged in a simulated grocery shopping task requiring the patient to visually  scan and identify "items" (colored pins) hidden throughout the therapy gym. Pins were intentionally camouflaged within the environment, placed at varying heights (at, above, and below eye level) to encourage thorough environmental scanning. This was also used as a preparatory task for real world functional needs, such as reading road signs or locating objects in a store. Visual scanning strategies were emphasized throughout including: -Scanning left to right, top to bottom -Turning and re-scanning the environment from different angles -Slowing down to increase visual accuracy  Patient initially demonstrated decreased scanning on the left side and missed several pins during the first pass. However, with moderate verbal cueing and after being prompted to turn around and re-scan, he was better able to locate the missed pins, particularly on the right side. Once found, patient demonstrated depth perception by placing the pins onto a pole.  At the end of the session pt used visual scanning to search and find animals in a book. He was asked to read and follow specific instructions to find targeted animals on the page, further supporting cognitive and visual perceptual skills.  Throughout the entirety of the session,  strategies and techniques practiced were related back to daily life to promote carryover, with the goal of improving the patient's ability to manage tasks such as reading signs, finding items in the home or store, and increasing overall safety and independence.  - Self-care/home management completed for duration as noted below including:  OT reviewed general vision recommendations including compensatory strategies including turning head to left to compensate for field cut as well as use of scanning to improve safety with functional mobility and accurate location of items to promote visual neuro rehabilitation following CVA.  Pt educated and encouraged to continue to practice tasks that he has been doing but also increasing the challenge.   PATIENT EDUCATION: Education details: Financial risk analyst, Manufacturing systems engineer Person educated: Patient Education method: Explanation, Demonstration, Tactile cues, Verbal cues, and Handouts Education comprehension: verbalized understanding, verbal cues required, and needs further education  HOME EXERCISE PROGRAM: 06/24/2024: Cecilia solitaire  GOALS: Goals reviewed with patient? Yes  SHORT TERM GOALS: Target date: 07/03/24   Patient will independently recall at least 2 compensatory strategies and 2 AE options for visual impairment without cueing. Baseline: New to outpt OT  Goal status: MET 06/24/24   2.  Patient will use head turn and scanning techniques to locate stimuli presented in the lower quadrants of a computer screen during a structured visual task (e.g., complete online game/puzzle, locating icons, reading text) with 80% accuracy.  Baseline: New to outpt OT  Goal status: INITIAL    LONG TERM GOALS: Target date: 08/07/24   1.  Patient will use visual scanning, magnification, or app-based assistance to identify items on grocery shelves at mid- to far-distance in 3/5 observed tasks. Baseline: New to outpt OT  Goal status: IN PROGRESS  2. Patient  will independently identify street signs located at or below eye level (e.g., stop signs, parking signs) from the sidewalk or vehicle in a simulated or real-world environment, improving safety in community navigation.  Baseline: New to outpt OT  Goal status: IN PROGRESS   3.  Patient will report at least two-point increase in average PSFS score or at least three-point increase in a single activity score indicating functionally significant improvement given minimum detectable change. Baseline: 7.0 total score (See above for individual activity scores) Goal status: IN PROGRESS  ASSESSMENT:  CLINICAL IMPRESSION: Patient presents with visual deficits secondary to CVA. He demonstrates  good rehab potential as evidence to being able to identify compensatory strategies that can be used to improve vision for every day task. He will continue to benefit from skilled outpatient OT to improve functional independence for ADLs and IADLs.     PERFORMANCE DEFICITS: in functional skills including proprioception, sensation, decreased knowledge of precautions, decreased knowledge of use of DME, and vision, cognitive skills including problem solving and safety awareness, and psychosocial skills including coping strategies, environmental adaptation, and routines and behaviors.   IMPAIRMENTS: are limiting patient from work and social participation.   CO-MORBIDITIES: may have co-morbidities  that affects occupational performance. Patient will benefit from skilled OT to address above impairments and improve overall function.  PLAN:  OT FREQUENCY: 1x/week  OT DURATION: 6 weeks  PLANNED INTERVENTIONS: 97168 OT Re-evaluation, 97535 self care/ADL training, 02469 therapeutic activity, visual/perceptual remediation/compensation, coping strategies training, patient/family education, and DME and/or AE instructions  RECOMMENDED OTHER SERVICES: NA  CONSULTED AND AGREED WITH PLAN OF CARE: Patient  PLAN FOR NEXT SESSION:   Visual Scanning (small bean bags) and with signage Puzzles (table and computer) Visual comp strategies and equipment (driving options) Functional task that include vision compensatory strategies   Jaylyn Iyer, Student-OT 06/24/2024, 3:33 PM

## 2024-07-01 ENCOUNTER — Ambulatory Visit: Admitting: Occupational Therapy

## 2024-07-01 DIAGNOSIS — R29818 Other symptoms and signs involving the nervous system: Secondary | ICD-10-CM

## 2024-07-01 DIAGNOSIS — M6281 Muscle weakness (generalized): Secondary | ICD-10-CM

## 2024-07-01 DIAGNOSIS — R41842 Visuospatial deficit: Secondary | ICD-10-CM

## 2024-07-01 NOTE — Therapy (Signed)
 OUTPATIENT OCCUPATIONAL THERAPY NEURO TREATMENT  Patient Name: Wise Fees MRN: [id number] DOB:1951-01-26, 73 y.o., male Today's Date: 07/01/2024  PCP: Maree Leni Edyth DELENA, MD REFERRING PROVIDER: Trixie Nilda HERO, MD  END OF SESSION:  OT End of Session - 07/01/24 1730     Visit Number 3    Number of Visits 6    Date for Recertification  08/07/24    Authorization Type [employer] 2025    Authorization Time Period NO AUTH REQUIRED VL:MN    OT Start Time 1445    OT Stop Time 1536    OT Time Calculation (min) 51 min    Activity Tolerance Patient tolerated treatment well    Behavior During Therapy WFL for tasks assessed/performed           Past Medical History:  Diagnosis Date   Borderline hyperlipidemia    Cancer (HCC)    Frequent headaches    Peptic ulcer    Vertigo    Past Surgical History:  Procedure Laterality Date   INGUINAL HERNIA REPAIR     Patient Active Problem List   Diagnosis Date Noted   Hemorrhagic stroke (HCC) 05/08/2024   Hypertensive emergency 05/08/2024   Blurry vision, bilateral 05/08/2024   Essential hypertension 05/08/2024   Chronic vertigo 05/08/2024   Hyperlipidemia 05/08/2024    ONSET DATE: 05/10/2024  REFERRING DIAG: I63.9 (ICD-10-CM) - CVA (cerebral vascular accident)  THERAPY DIAG:  Muscle weakness (generalized)  Other symptoms and signs involving the nervous system  Visuospatial deficit  Rationale for Evaluation and Treatment: Rehabilitation  SUBJECTIVE:   SUBJECTIVE STATEMENT: Pt reports that he feels the environmental strategies have been helping. He feels that at one minute he is making progress and one minute he feels that he has backtracked. Pt also reported again that he feels his R side is improving but the L is still not the best. Pt feels that he can see looking in the bottom left better than the top left as he can't make out anything. Pt reported he will be getting a field of vision test soon.   Pt accompanied  by: self  PERTINENT HISTORY:   PMHx: chronic vertigo, seborrheic keratosis, GERD, aortic atherosclerosis, essential hypertension, inguinal hernia, and hyperlipidemia   Pt referred from [facility] to emergency department for complaining of bilateral peripheral vision loss/blurriness that started Thursday morning 7/31, along with headache  MRI: 1. Acute right PCA territory infarct with associated edema, local mass effect, and parenchymal hematoma (hemorrhagic conversion). No midline shift. Consider CT head for further evaluation of hemorrhage. 2. Mild chronic microvascular ischemic changes and mild parenchymal volume loss.  PRECAUTIONS: None  WEIGHT BEARING RESTRICTIONS: No  PAIN:  Are you having pain? No  FALLS: Has patient fallen in last 6 months? 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  HAND DOMINANCE: Right  ADLs: Overall ADLs: Pt reports good participation in ADLs without difficulty IADLs: Shopping: Ind Light housekeeping: Ind - 4 loads on Sunday s/p beach trip Meal Prep: Ind - cooked multiple meals on vacation Community mobility: Ind  Medication management: Ind Financial management: Ind Handwriting: No changes  MOBILITY STATUS: Independent  POSTURE COMMENTS:  rounded shoulders and forward head Sitting balance: WNL  ACTIVITY TOLERANCE: Activity tolerance: Good  FUNCTIONAL OUTCOME MEASURES: PSFS - 7.0   UPPER EXTREMITY ROM:   WFL  UPPER EXTREMITY MMT:     MMT Right eval Left eval  Shoulder flexion 4+ 4-  Shoulder abduction     Shoulder adduction    Shoulder extension    Shoulder internal rotation    Shoulder external rotation    Middle trapezius    Lower trapezius    Elbow flexion 5 5  Elbow extension 5 5  Wrist flexion    Wrist extension    Wrist ulnar deviation    Wrist radial deviation    Wrist pronation    Wrist supination    (Blank rows = not tested)  HAND FUNCTION: Grip strength: Right: 71.6, 77.8, 76.4 lbs; Left: 82.8, 71.8, 78.9 lbs Average Right: 75.3 lbs Left: 77.8 lbs COORDINATION: No concerns from pt ie) no trouble with cutting vegetables etc this past weekend  SENSATION: WFL  EDEMA: NA  MUSCLE TONE: WFL  COGNITION: Overall cognitive status: Within functional limits for tasks assessed  VISION:  Subjective report: Pt reports using reading glasses x 10 + years. No double vision since stroke, just difficulty with peripheral vision.  Pt did report he has glasses for glare and that improved his vision dramatically - polarized. Baseline vision: Wears glasses for reading only Visual history: cataracts noted by MD upon visit for this vision issue 7/31,   VISION ASSESSMENT: Tracking/Visual pursuits: Able to track stimulus in all quads without difficulty Visual Fields: Right visual field deficits and Left visual field deficits right ~ 45* > left ~ 30*  Patient has difficulty with following activities due to following visual impairments: peripheral vision, difficulty with laptop computer ie) in the lower quadrant of vision but not with TV on wall ie) no problems.  Per PSFS: difficulty with scanning in grocery store, seeing computer screen in lower quadrant and reading streat signs on posts ie) lower than the overhead signs at stop light level.  Hospital OT eval note:  Baseline Vision/History: 0 No visual deficits Patient Visual Report: Peripheral vision impairment Vision Assessment?: Yes Eye Alignment: Within Functional Limits Ocular Range of Motion: Within Functional  Limits Alignment/Gaze Preference: Within Defined Limits Tracking/Visual Pursuits: Able to track stimulus in all quads without difficulty Visual Fields: Left visual field deficit Additional Comments: L eye L field cut, R eye with mild LLQ defiicit but more pronounced LUQ deficit.                                                                                                                          TODAY'S TREATMENT:   - Therapeutic activities completed for duration as noted below including:  OT placed 6 BlazePods in front of patient and had patient tap pods with palm of hand and bimanually as pods lit up  using OT Distraction and Scanning mode forreaction time, scanning and locating of items, processing, motor planning, and item/pattern recognition.   Hits were as follows:  BUE 90 hits for 2 min duration and 1.1 sec reaction time BUE 78 hits for 2 min duration and 1.4 sec reaction time      Pt started a 24 piece puzzle for visual scanning and use of BUE. Pt was able to sustain attention and used visual scanning strategies such as scanning left to right and top to bottom as well as slowing down to pace himself for accurate scanning. Pt was unable to complete due to limited time but will continue at the next session.    - Self-care/home management completed for duration as noted below including:  OT reviewed general vision recommendations including compensatory strategies including turning head to left to compensate for field cut as well as use of scanning to improve safety with functional mobility and accurate location of items to promote visual neuro rehabilitation following CVA.  Pt educated and encouraged to continue to practice tasks that he has been doing but also increasing the challenge.   Peripheral vision was assessed by having pt extend/ abduct arms at his side and notify therapist when his arms appear in his peripheral vision. L peripheral vision is about  45 degrees.  No reports of  double vision. Pt doesn't wear glasses. Patient reported he has cataracts but not severe enough for surgery according to his doctor it is more of a neurological disorder.   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 Yes  SHORT TERM GOALS: Target date: 07/03/24   Patient will independently recall at least 2 compensatory strategies and 2 AE options for visual impairment without cueing. Baseline: New to outpt OT  Goal status: MET 06/24/24   2.  Patient will use head turn and scanning techniques to locate stimuli presented in the lower quadrants of a computer screen during a structured visual task (e.g., complete online game/puzzle, locating icons, reading text) with 80% accuracy.  Baseline: New to outpt OT  Goal status: INITIAL    LONG TERM GOALS: Target date: 08/07/24   1.  Patient will use visual scanning, magnification, or app-based assistance to identify items on grocery shelves at mid- to far-distance in 3/5 observed tasks. Baseline: New to outpt OT  Goal status: IN PROGRESS  2. Patient will independently identify street signs located at or below eye level (e.g., stop signs, parking signs) from the sidewalk or vehicle in a simulated or real-world environment, improving safety in community navigation.  Baseline: New to outpt OT  Goal status: IN PROGRESS   3.  Patient will report at least two-point increase in average PSFS score or at least three-point increase in a single activity score indicating functionally significant improvement given minimum detectable change. Baseline: 7.0 total score (See above for individual activity scores) Goal status: IN  PROGRESS  ASSESSMENT:  CLINICAL IMPRESSION: Patient presents with visual deficits secondary to CVA. He demonstrates good rehab potential as evidence to being able to identify compensatory strategies that can be used to improve vision for every day task. He will continue to benefit from skilled outpatient OT to improve functional independence for ADLs and IADLs.     PERFORMANCE DEFICITS: in functional skills including proprioception, sensation, decreased  knowledge of precautions, decreased knowledge of use of DME, and vision, cognitive skills including problem solving and safety awareness, and psychosocial skills including coping strategies, environmental adaptation, and routines and behaviors.   IMPAIRMENTS: are limiting patient from work and social participation.   CO-MORBIDITIES: may have co-morbidities  that affects occupational performance. Patient will benefit from skilled OT to address above impairments and improve overall function.  PLAN:  OT FREQUENCY: 1x/week  OT DURATION: 6 weeks  PLANNED INTERVENTIONS: 97168 OT Re-evaluation, 97535 self care/ADL training, 02469 therapeutic activity, visual/perceptual remediation/compensation, coping strategies training, patient/family education, and DME and/or AE instructions  RECOMMENDED OTHER SERVICES: NA  CONSULTED AND AGREED WITH PLAN OF CARE: Patient  PLAN FOR NEXT SESSION:  Visual Scanning (small bean bags) and with signage ( environmental scanning) Finish puzzle Visual comp strategies and equipment (driving options) Functional task that include vision compensatory strategies  Barbar Brede, Student-OT 07/01/2024, 5:31 PM

## 2024-07-08 ENCOUNTER — Ambulatory Visit: Attending: Neurology | Admitting: Occupational Therapy

## 2024-07-08 DIAGNOSIS — R41842 Visuospatial deficit: Secondary | ICD-10-CM | POA: Diagnosis present

## 2024-07-08 DIAGNOSIS — R29818 Other symptoms and signs involving the nervous system: Secondary | ICD-10-CM | POA: Insufficient documentation

## 2024-07-08 DIAGNOSIS — M6281 Muscle weakness (generalized): Secondary | ICD-10-CM | POA: Diagnosis present

## 2024-07-08 NOTE — Therapy (Signed)
 OUTPATIENT OCCUPATIONAL THERAPY NEURO TREATMENT  Patient Name: Gordon Jacobs MRN: [id number] DOB:Feb 24, 1951, 73 y.o., male Today's Date: 07/08/2024  PCP: Maree Leni Edyth DELENA, MD REFERRING PROVIDER: Trixie Nilda HERO, MD  END OF SESSION:  OT End of Session - 07/08/24 1450     Visit Number 4    Number of Visits 6    Date for Recertification  08/07/24    Authorization Type [employer] 2025    Authorization Time Period NO AUTH REQUIRED VL:MN    OT Start Time 1400    OT Stop Time 1447    OT Time Calculation (min) 47 min    Activity Tolerance Patient tolerated treatment well    Behavior During Therapy WFL for tasks assessed/performed            Past Medical History:  Diagnosis Date   Borderline hyperlipidemia    Cancer (HCC)    Frequent headaches    Peptic ulcer    Vertigo    Past Surgical History:  Procedure Laterality Date   INGUINAL HERNIA REPAIR     Patient Active Problem List   Diagnosis Date Noted   Hemorrhagic stroke (HCC) 05/08/2024   Hypertensive emergency 05/08/2024   Blurry vision, bilateral 05/08/2024   Essential hypertension 05/08/2024   Chronic vertigo 05/08/2024   Hyperlipidemia 05/08/2024    ONSET DATE: 05/10/2024  REFERRING DIAG: I63.9 (ICD-10-CM) - CVA (cerebral vascular accident)  THERAPY DIAG:  Muscle weakness (generalized)  Other symptoms and signs involving the nervous system  Visuospatial deficit  Rationale for Evaluation and Treatment: Rehabilitation  SUBJECTIVE:   SUBJECTIVE STATEMENT: Patient reported that his vision is better upon waking in the morning but tends to worsen as the day progresses. Despite this, he feels his overall vision is improved compared to two weeks ago. The patient also reported experiencing week to week improvements, with some variability of having good days and bad days for his vision. Pt also reported he has no trouble with reading, managing finances or shopping online. However, still has some  trouble with visual scanning in the grocery stores.   Pt accompanied by: self  PERTINENT HISTORY:   PMHx: chronic vertigo, seborrheic keratosis, GERD, aortic atherosclerosis, essential hypertension, inguinal hernia, and hyperlipidemia   Pt referred from [facility] to emergency department for complaining of bilateral peripheral vision loss/blurriness that started Thursday morning 7/31, along with headache  MRI: 1. Acute right PCA territory infarct with associated edema, local mass effect, and parenchymal hematoma (hemorrhagic conversion). No midline shift. Consider CT head for further evaluation of hemorrhage. 2. Mild chronic microvascular ischemic changes and mild parenchymal volume loss.  PRECAUTIONS: None  WEIGHT BEARING RESTRICTIONS: No  PAIN:  Are you having pain? No  FALLS: Has patient fallen in last 6 months? 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  HAND DOMINANCE: Right  ADLs: Overall ADLs: Pt reports good participation in ADLs without difficulty IADLs: Shopping: Ind Light housekeeping: Ind - 4 loads on Sunday s/p beach trip Meal Prep: Ind - cooked multiple meals on vacation Community mobility: Ind Medication  management: Ind Financial management: Ind Handwriting: No changes  MOBILITY STATUS: Independent  POSTURE COMMENTS:  rounded shoulders and forward head Sitting balance: WNL  ACTIVITY TOLERANCE: Activity tolerance: Good  FUNCTIONAL OUTCOME MEASURES: PSFS - 7.0   UPPER EXTREMITY ROM:   WFL  UPPER EXTREMITY MMT:     MMT  Right eval Left eval  Shoulder flexion 4+ 4-  Shoulder abduction    Shoulder adduction    Shoulder extension    Shoulder internal rotation    Shoulder external rotation    Middle trapezius    Lower trapezius    Elbow flexion 5 5  Elbow extension 5 5  Wrist flexion    Wrist extension    Wrist ulnar deviation    Wrist radial deviation    Wrist pronation    Wrist supination    (Blank rows = not tested)  HAND FUNCTION: Grip strength: Right: 71.6, 77.8, 76.4 lbs; Left: 82.8, 71.8, 78.9 lbs Average Right: 75.3 lbs Left: 77.8 lbs COORDINATION: No concerns from pt ie) no trouble with cutting vegetables etc this past weekend  SENSATION: WFL  EDEMA: NA  MUSCLE TONE: WFL  COGNITION: Overall cognitive status: Within functional limits for tasks assessed  VISION:  Subjective report: Pt reports using reading glasses x 10 + years. No double vision since stroke, just difficulty with peripheral vision.  Pt did report he has glasses for glare and that improved his vision dramatically - polarized. Baseline vision: Wears glasses for reading only Visual history: cataracts noted by MD upon visit for this vision issue 7/31,   VISION ASSESSMENT: Tracking/Visual pursuits: Able to track stimulus in all quads without difficulty Visual Fields: Right visual field deficits and Left visual field deficits right ~ 45* > left ~ 30*  Patient has difficulty with following activities due to following visual impairments: peripheral vision, difficulty with laptop computer ie) in the lower quadrant of vision but not with TV on wall ie) no problems.  Per PSFS: difficulty with scanning in grocery store, seeing computer screen in lower quadrant and reading streat signs on posts ie) lower than the overhead signs at stop light level.  Hospital OT eval note:  Baseline Vision/History: 0 No visual deficits Patient Visual Report: Peripheral vision impairment Vision Assessment?: Yes Eye Alignment: Within Functional  Limits Ocular Range of Motion: Within Functional Limits Alignment/Gaze Preference: Within Defined Limits Tracking/Visual Pursuits: Able to track stimulus in all quads without difficulty Visual Fields: Left visual field deficit Additional Comments: L eye L field cut, R eye with mild LLQ defiicit but more pronounced LUQ deficit.                                                                                                                          TODAY'S TREATMENT:   - Therapeutic activities completed for duration as noted below including:  Pt completed a 24 piece puzzle for visual scanning and use of BUE. Pt was able to sustain attention and used visual scanning strategies  such as scanning left to right and top to bottom as well as slowing down to pace himself for accurate scanning.   OT placed 12 UNO cards around the therapy gym, consisting of matching pairs by color and number, to promote visual scanning. The patient was encouraged to use compensatory strategies (as mentioned below) while scanning the environment.The patient required moderate verbal cueing to accurately locate the cards. OT observed that the patient frequently missed cards positioned on his left side and those placed at lower levels.The activity was graded up  to incorporate both visual scanning and memory recall. The patient was instructed to pick up one card at a time upon finding it, then scan the gym to locate its matching pair, working to recall the previously seen card locations.   At the conclusion of the session, cards were scattered across a countertop, requiring the patient to use compensatory strategies to accurately scan from top to bottom to locate matching pairs. The patient was able to locate 13 pairs within 5 minutes, completing the task independently without the need for cueing.  - Self-care/home management completed for duration as noted below including:  OT reviewed general vision recommendations including  compensatory strategies including turning head to left to compensate for field cut as well as use of scanning to improve safety with functional mobility and accurate location of items to promote visual neuro rehabilitation following CVA.  Pt educated and encouraged to continue to practice tasks that he has been doing but also increasing the challenge.    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 Yes  SHORT TERM GOALS: Target date: 07/03/24   Patient will independently recall at least 2 compensatory strategies and 2 AE options for visual impairment without cueing. Baseline: New to outpt OT  Goal status: MET 06/24/24   2.  Patient will use head turn and scanning techniques to locate stimuli presented in the lower quadrants of a computer screen during a structured visual task (e.g., complete online game/puzzle, locating icons, reading text) with 80% accuracy.  Baseline: New to outpt OT  Goal status: IN PROGRESS    LONG TERM GOALS: Target date: 08/07/24   1.  Patient will use visual scanning, magnification, or app-based assistance to identify items on grocery shelves at mid- to far-distance in 3/5 observed tasks. Baseline: New to outpt OT  Goal status: IN PROGRESS  2. Patient will independently identify street signs located at or below eye level (e.g., stop signs, parking signs) from the sidewalk or vehicle in a simulated or real-world environment, improving safety in community navigation.  Baseline: New to outpt OT  Goal status: IN PROGRESS   3.  Patient will report at least two-point increase in average PSFS score or at least three-point increase in a single activity score  indicating functionally significant improvement given minimum detectable change. Baseline: 7.0 total score (See above for individual activity scores) Goal status: IN PROGRESS  ASSESSMENT:  CLINICAL IMPRESSION: Patient presents with visual deficits secondary to CVA. He demonstrates good rehab potential as evidence to being able to identify compensatory strategies that can be used to improve vision for every day task. He will continue to benefit from skilled outpatient OT to improve functional independence for ADLs and IADLs.     PERFORMANCE DEFICITS: in functional skills including  proprioception, sensation, decreased knowledge of precautions, decreased knowledge of use of DME, and vision, cognitive skills including problem solving and safety awareness, and psychosocial skills including coping strategies, environmental adaptation, and routines and behaviors.   IMPAIRMENTS: are limiting patient from work and social participation.   CO-MORBIDITIES: may have co-morbidities  that affects occupational performance. Patient will benefit from skilled OT to address above impairments and improve overall function.  PLAN:  OT FREQUENCY: 1x/week  OT DURATION: 6 weeks  PLANNED INTERVENTIONS: 97168 OT Re-evaluation, 97535 self care/ADL training, 02469 therapeutic activity, visual/perceptual remediation/compensation, coping strategies training, patient/family education, and DME and/or AE instructions  RECOMMENDED OTHER SERVICES: NA  CONSULTED AND AGREED WITH PLAN OF CARE: Patient  PLAN FOR NEXT SESSION:  Visual Scanning (small bean bags) and with signage ( environmental scanning) Visual comp strategies and equipment (driving options) Functional task that include vision compensatory strategies for home carryover  Donyale Falcon, Student-OT 07/08/2024, 4:20 PM

## 2024-07-15 ENCOUNTER — Ambulatory Visit: Admitting: Occupational Therapy

## 2024-07-15 DIAGNOSIS — R29818 Other symptoms and signs involving the nervous system: Secondary | ICD-10-CM

## 2024-07-15 DIAGNOSIS — R41842 Visuospatial deficit: Secondary | ICD-10-CM

## 2024-07-15 DIAGNOSIS — M6281 Muscle weakness (generalized): Secondary | ICD-10-CM

## 2024-07-15 NOTE — Therapy (Cosign Needed)
 OUTPATIENT OCCUPATIONAL THERAPY NEURO TREATMENT  Patient Name: Gordon Jacobs MRN: [id number] DOB:08-25-1951, 73 y.o., male Today's Date: 07/15/2024  PCP: Maree Leni Edyth DELENA, MD REFERRING PROVIDER: Trixie Nilda HERO, MD  END OF SESSION:  OT End of Session - 07/15/24 1451     Visit Number 5    Number of Visits 6    Date for Recertification  08/07/24    Authorization Type [employer] 2025    Authorization Time Period NO AUTH REQUIRED VL:MN    OT Start Time 1450    OT Stop Time 1542    OT Time Calculation (min) 52 min    Activity Tolerance Patient tolerated treatment well    Behavior During Therapy WFL for tasks assessed/performed            Past Medical History:  Diagnosis Date   Borderline hyperlipidemia    Cancer (HCC)    Frequent headaches    Peptic ulcer    Vertigo    Past Surgical History:  Procedure Laterality Date   INGUINAL HERNIA REPAIR     Patient Active Problem List   Diagnosis Date Noted   Hemorrhagic stroke (HCC) 05/08/2024   Hypertensive emergency 05/08/2024   Blurry vision, bilateral 05/08/2024   Essential hypertension 05/08/2024   Chronic vertigo 05/08/2024   Hyperlipidemia 05/08/2024    ONSET DATE: 05/10/2024  REFERRING DIAG: I63.9 (ICD-10-CM) - CVA (cerebral vascular accident)  THERAPY DIAG:  Muscle weakness (generalized)  Other symptoms and signs involving the nervous system  Visuospatial deficit  Rationale for Evaluation and Treatment: Rehabilitation  SUBJECTIVE:   SUBJECTIVE STATEMENT: Patient reports significant improvement in right peripheral vision, stating it has improved 100%. Left peripheral vision has also improved but is still not at desired level. Notes improvement in daily activities such as grocery shopping. Patient is actively working on visual strategies both at home and when out in public emphasizing that he is doing everything possible to optimize his visual function.  Pt accompanied by: self  PERTINENT  HISTORY:   PMHx: chronic vertigo, seborrheic keratosis, GERD, aortic atherosclerosis, essential hypertension, inguinal hernia, and hyperlipidemia   Pt referred from [facility] to emergency department for complaining of bilateral peripheral vision loss/blurriness that started Thursday morning 7/31, along with headache  MRI: 1. Acute right PCA territory infarct with associated edema, local mass effect, and parenchymal hematoma (hemorrhagic conversion). No midline shift. Consider CT head for further evaluation of hemorrhage. 2. Mild chronic microvascular ischemic changes and mild parenchymal volume loss.  PRECAUTIONS: None  WEIGHT BEARING RESTRICTIONS: No  PAIN:  Are you having pain? No  FALLS: Has patient fallen in last 6 months? 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  HAND DOMINANCE: Right  ADLs: Overall ADLs: Pt reports good participation in ADLs without difficulty IADLs: Shopping: Ind Light housekeeping: Ind - 4 loads on Sunday s/p beach trip Meal Prep: Ind - cooked multiple meals on vacation Community mobility: Ind Medication management: Ind Landscape architect: Ind Handwriting: No changes  MOBILITY STATUS: Independent  POSTURE COMMENTS:  rounded shoulders and forward head  Sitting balance: WNL  ACTIVITY TOLERANCE: Activity tolerance: Good  FUNCTIONAL OUTCOME MEASURES: PSFS - 7.0   UPPER EXTREMITY ROM:   WFL  UPPER EXTREMITY MMT:     MMT Right eval Left eval  Shoulder flexion 4+ 4-  Shoulder abduction    Shoulder adduction     Shoulder extension    Shoulder internal rotation    Shoulder external rotation    Middle trapezius    Lower trapezius    Elbow flexion 5 5  Elbow extension 5 5  Wrist flexion    Wrist extension    Wrist ulnar deviation    Wrist radial deviation    Wrist pronation    Wrist supination    (Blank rows = not tested)  HAND FUNCTION: Grip strength: Right: 71.6, 77.8, 76.4 lbs; Left: 82.8, 71.8, 78.9 lbs Average Right: 75.3 lbs Left: 77.8 lbs COORDINATION: No concerns from pt ie) no trouble with cutting vegetables etc this past weekend  SENSATION: WFL  EDEMA: NA  MUSCLE TONE: WFL  COGNITION: Overall cognitive status: Within functional limits for tasks assessed  VISION:  Subjective report: Pt reports using reading glasses x 10 + years. No double vision since stroke, just difficulty with peripheral vision.  Pt did report he has glasses for glare and that improved his vision dramatically - polarized. Baseline vision: Wears glasses for reading only Visual history: cataracts noted by MD upon visit for this vision issue 7/31,   VISION ASSESSMENT: Tracking/Visual pursuits: Able to track stimulus in all quads without difficulty Visual Fields: Right visual field deficits and Left visual field deficits right ~ 45* > left ~ 30*  Patient has difficulty with following activities due to following visual impairments: peripheral vision, difficulty with laptop computer ie) in the lower quadrant of vision but not with TV on wall ie) no problems.  Per PSFS: difficulty with scanning in grocery store, seeing computer screen in lower quadrant and reading streat signs on posts ie) lower than the overhead signs at stop light level.  Hospital OT eval note:  Baseline Vision/History: 0 No visual deficits Patient Visual Report: Peripheral vision impairment Vision Assessment?: Yes Eye Alignment: Within Functional Limits Ocular Range of Motion: Within Functional Limits Alignment/Gaze Preference: Within  Defined Limits Tracking/Visual Pursuits: Able to track stimulus in all quads without difficulty Visual Fields: Left visual field deficit Additional Comments: L eye L field cut, R eye with mild LLQ defiicit but more pronounced LUQ deficit.                                                                                                                          TODAY'S TREATMENT:   - Therapeutic activities completed for duration as noted below including:  Patient participated in a visual scanning kitchen simulation activity. He was required to identify and avoid potential environmental hazards such as hot surfaces, sharp objects, spills, and items that could cause tripping or falls. Visual scanning strategies included scanning from top to bottom, turning his  head fully to the right and left, and using left to right sweeps to locate food items placed throughout the kitchen while maintaining awareness of safety hazards.  Following the kitchen task, patient engaged in a fine motor coordination activity designed to promote continued visual scanning. OT placed pattern sheets on the right and left walls. Patient was instructed to scan for each pattern and retrieve the colored pegs that correspond with the matching the pattern. Patient verbalized difficulty distinguishing between yellow, orange, and red pegs, indicating possible color discrimination challenges.  OT also reviewed general vision recommendations including compensatory strategies including turning head to left to compensate for field cut as well as use of scanning to improve safety with functional mobility and accurate location of items to promote visual neuro rehabilitation following CVA.  Pt educated and encouraged to continue to practice tasks that he has been doing but also increasing the challenge.    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 Yes  SHORT TERM GOALS: Target date: 07/03/24   Patient will independently recall at least 2 compensatory strategies and 2 AE options for visual impairment without cueing. Baseline: New to outpt OT  Goal status: MET 06/24/24   2.  Patient will use head turn and scanning techniques to locate stimuli presented in the lower quadrants of a computer screen during a structured visual task (e.g., complete online game/puzzle, locating icons, reading text) with 80% accuracy.  Baseline: New to outpt OT  Goal status: IN PROGRESS    LONG TERM GOALS: Target date: 08/07/24   1.  Patient will use visual scanning, magnification, or app-based assistance to identify items on grocery shelves at mid- to far-distance in 3/5 observed tasks. Baseline: New to outpt OT  Goal status: IN PROGRESS  2. Patient will independently identify street signs located at or below eye level (e.g., stop signs, parking signs) from the sidewalk or vehicle in a simulated or real-world environment, improving safety in community navigation.  Baseline: New to outpt OT  Goal status: IN PROGRESS   3.  Patient will report at least two-point increase in average PSFS score or at least three-point increase in a single activity score indicating functionally significant improvement given minimum detectable change. Baseline: 7.0 total score (See above for individual activity scores) Goal status: IN PROGRESS  ASSESSMENT:  CLINICAL IMPRESSION: Patient presents with visual deficits secondary to CVA. He demonstrates good rehab potential as evidence to being able to identify compensatory strategies that can be used to improve vision for every day task. He will continue to benefit from skilled outpatient OT to improve functional  independence for ADLs and IADLs.     PERFORMANCE DEFICITS: in functional skills including proprioception, sensation, decreased knowledge of precautions, decreased knowledge of use of DME, and vision, cognitive skills including problem solving and safety awareness, and psychosocial skills including coping strategies, environmental adaptation, and routines and behaviors.   IMPAIRMENTS: are limiting patient from work and social participation.   CO-MORBIDITIES: may have co-morbidities  that affects occupational performance. Patient will benefit from skilled OT to address above impairments and improve overall function.  PLAN:  OT FREQUENCY: 1x/week  OT DURATION: 6 weeks  PLANNED INTERVENTIONS: 02831 OT Re-evaluation, 97535 self care/ADL training, 02469 therapeutic activity, visual/perceptual remediation/compensation, coping strategies training, patient/family education, and DME and/or  AE instructions  RECOMMENDED OTHER SERVICES: NA  CONSULTED AND AGREED WITH PLAN OF CARE: Patient  PLAN FOR NEXT SESSION:  UNO/ skipbo/  other games Visual scanning activities - street signs Review and update goals   Jolicia Delira, Student-OT 07/15/2024, 4:09 PM

## 2024-07-22 ENCOUNTER — Ambulatory Visit: Admitting: Occupational Therapy

## 2024-07-22 DIAGNOSIS — R29818 Other symptoms and signs involving the nervous system: Secondary | ICD-10-CM

## 2024-07-22 DIAGNOSIS — R41842 Visuospatial deficit: Secondary | ICD-10-CM | POA: Diagnosis not present

## 2024-07-22 DIAGNOSIS — M6281 Muscle weakness (generalized): Secondary | ICD-10-CM

## 2024-07-22 NOTE — Therapy (Signed)
 OUTPATIENT OCCUPATIONAL THERAPY NEURO TREATMENT & DISCHARGE  Patient Name: Gordon Jacobs MRN: [id number] DOB:09/08/1951, 73 y.o., male Today's Date: 07/22/2024  PCP: Maree Leni Edyth DELENA, MD REFERRING PROVIDER: Trixie Nilda HERO, MD  END OF SESSION:  OT End of Session - 07/22/24 1358     Visit Number 6    Number of Visits 6    Date for Recertification  08/07/24    Authorization Type [employer] 2025    Authorization Time Period NO AUTH REQUIRED VL:MN    OT Start Time 1400    OT Stop Time 1447    OT Time Calculation (min) 47 min    Equipment Utilized During Treatment Computer    Activity Tolerance Patient tolerated treatment well    Behavior During Therapy WFL for tasks assessed/performed            Past Medical History:  Diagnosis Date   Borderline hyperlipidemia    Cancer (HCC)    Frequent headaches    Peptic ulcer    Vertigo    Past Surgical History:  Procedure Laterality Date   INGUINAL HERNIA REPAIR     Patient Active Problem List   Diagnosis Date Noted   Hemorrhagic stroke (HCC) 05/08/2024   Hypertensive emergency 05/08/2024   Blurry vision, bilateral 05/08/2024   Essential hypertension 05/08/2024   Chronic vertigo 05/08/2024   Hyperlipidemia 05/08/2024    ONSET DATE: 05/10/2024  REFERRING DIAG: I63.9 (ICD-10-CM) - CVA (cerebral vascular accident)  THERAPY DIAG:  Muscle weakness (generalized)  Other symptoms and signs involving the nervous system  Visuospatial deficit  Rationale for Evaluation and Treatment: Rehabilitation  SUBJECTIVE:   SUBJECTIVE STATEMENT: Patient reported feeling stuck with no noticeable improvement in his left peripheral vision over the past week. Although this has not caused difficulty with daily activities, he noted the need to be more cautious during tasks such as driving or other activities requiring heightened visual awareness. He reported that when looking down and to the side, he is able to detect movement or  activity; however, when raising his hand to approximately 90 degrees, he begins to experience a decrease in left peripheral visual awareness.  Pt accompanied by: self  PERTINENT HISTORY:   PMHx: chronic vertigo, seborrheic keratosis, GERD, aortic atherosclerosis, essential hypertension, inguinal hernia, and hyperlipidemia   Pt referred from [facility] to emergency department for complaining of bilateral peripheral vision loss/blurriness that started Thursday morning 7/31, along with headache  MRI: 1. Acute right PCA territory infarct with associated edema, local mass effect, and parenchymal hematoma (hemorrhagic conversion). No midline shift. Consider CT head for further evaluation of hemorrhage. 2. Mild chronic microvascular ischemic changes and mild parenchymal volume loss.  PRECAUTIONS: None  WEIGHT BEARING RESTRICTIONS: No  PAIN:  Are you having pain? No  FALLS: Has patient fallen in last 6 months? 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  HAND DOMINANCE: Right  ADLs: Overall ADLs: Pt reports good participation in ADLs without difficulty IADLs: Shopping: Ind Light housekeeping: Ind - 4 loads on Sunday s/p beach trip Meal Prep:  Ind - cooked multiple meals on vacation Community mobility: Ind Medication management: Ind Landscape architect: Ind Handwriting: No changes  MOBILITY STATUS: Independent  POSTURE COMMENTS:  rounded shoulders and forward head Sitting balance: WNL  ACTIVITY TOLERANCE: Activity tolerance: Good  FUNCTIONAL OUTCOME  MEASURES: PSFS - 7.0   07/22/24   UPPER EXTREMITY ROM:   WFL  UPPER EXTREMITY MMT:     MMT Right eval Left eval  Shoulder flexion 4+ 4-  Shoulder abduction    Shoulder adduction    Shoulder extension    Shoulder internal rotation    Shoulder external rotation    Middle trapezius    Lower trapezius    Elbow flexion 5 5  Elbow extension 5 5  Wrist flexion    Wrist extension    Wrist ulnar deviation    Wrist radial deviation    Wrist pronation    Wrist supination    (Blank rows = not tested)  HAND FUNCTION: Grip strength: Right: 71.6, 77.8, 76.4 lbs; Left: 82.8, 71.8, 78.9 lbs Average Right: 75.3 lbs Left: 77.8 lbs COORDINATION: No concerns from pt ie) no trouble with cutting vegetables etc this past weekend  SENSATION: WFL  EDEMA: NA  MUSCLE TONE: WFL  COGNITION: Overall cognitive status: Within functional limits for tasks assessed  VISION:  Subjective report: Pt reports using reading glasses x 10 + years. No double vision since stroke, just difficulty with peripheral vision.  Pt did report he has glasses for glare and that improved his vision dramatically - polarized. Baseline vision: Wears glasses for reading only Visual history: cataracts noted by MD upon visit for this vision issue 7/31,   VISION ASSESSMENT: Tracking/Visual pursuits: Able to track stimulus in all quads without difficulty Visual Fields: Right visual field deficits and Left visual field deficits right ~ 45* > left ~ 30*  Patient has difficulty with following activities due to following visual impairments: peripheral vision, difficulty with laptop computer ie) in the lower quadrant of vision but not with TV on wall ie) no problems.  Per PSFS: difficulty with scanning in grocery store, seeing computer screen in lower quadrant and reading streat signs on posts ie) lower than the overhead signs at stop light level.  Hospital OT eval note:  Baseline Vision/History: 0 No visual deficits Patient  Visual Report: Peripheral vision impairment Vision Assessment?: Yes Eye Alignment: Within Functional Limits Ocular Range of Motion: Within Functional Limits Alignment/Gaze Preference: Within Defined Limits Tracking/Visual Pursuits: Able to track stimulus in all quads without difficulty Visual Fields: Left visual field deficit Additional Comments: L eye L field cut, R eye with mild LLQ defiicit but more pronounced LUQ deficit.                                                                                                                          TODAY'S TREATMENT:   - Therapeutic activities completed for duration as noted below including:  Patient engaged in computer games to promote visual scanning in  the lower left quadrant of the screen. Patient reported no longer experiencing difficulty in this area, which was supported by improved game scores.  Self-care/home management activities were completed for the duration as noted below: OT reviewed the patient's remaining goals, and the patient verbalized confidence in using visual strategies independently at home and in the community. He identified the following strategies currently in use: engaging in puzzles and card games as visual exercises, turning his head to improve scanning, and increasing awareness of the left-hand side in daily activities such as avoiding bumping his left hand as he used to. He also described functional safety strategies such as placing his hand on stair railings when going up and down stairs, paying closer attention when descending, positioning items centrally when cutting food, and being mindful of stove burners located on the left side.  OT provided education on the importance of turning the body and backing up to bring items into the visual field. The patient verbalized understanding of these techniques.  The patient stated he feels prepared for discharge at this time and will continue using these strategies at home and  in the community. He expressed intent to follow up with a low vision specialist in the future for possible AE such as glasses. OT provided the patient with a business card and encouraged him to reach out with any questions or concerns. Patient verbalized understanding.  OT also reviewed general vision recommendations including compensatory strategies including turning head to left to compensate for field cut as well as use of scanning to improve safety with functional mobility and accurate location of items to promote visual neuro rehabilitation following CVA.  Pt educated and encouraged to continue to practice tasks that he has been doing but also increasing the challenge.   PATIENT EDUCATION: Education details: Reviewed visual comp strategies Person educated: Patient Education method: Explanation, Demonstration, Tactile cues, and Verbal cues Education comprehension: verbalized understanding and returned demonstration  HOME EXERCISE PROGRAM: 06/24/2024: Cecilia solitaire  GOALS: Goals reviewed with patient? Yes  SHORT TERM GOALS: Target date: 07/03/24   Patient will independently recall at least 2 compensatory strategies and 2 AE options for visual impairment without cueing. Baseline: New to outpt OT  Goal status: MET 06/24/24   2.  Patient will use head turn and scanning techniques to locate stimuli presented in the lower quadrants of a computer screen during a structured visual task (e.g., complete online game/puzzle, locating icons, reading text) with 80% accuracy.  Baseline: New to outpt OT  Goal status: MET 07/22/24    LONG TERM GOALS: Target date: 08/07/24   1.  Patient will use visual scanning, magnification, or app-based assistance to identify items on grocery shelves at mid- to far-distance in 3/5 observed tasks. Baseline: New to outpt OT  Goal status: MET per pt report (may turn around to have items on R side as needed)  2. Patient will independently identify street signs  located at or below eye level (e.g., stop signs, parking signs) from the sidewalk or vehicle in a simulated or real-world environment, improving safety in community navigation.  Baseline: New to outpt OT  Goal status:MET- able to see street signs that are at or above eye level. However, trouble seeing signs that are higher up such as those placed on wires.    3.  Patient will report at least two-point increase in average PSFS score or at least three-point increase in a single activity score indicating functionally significant improvement given minimum detectable change. Baseline: 7.0 total  score (See above for individual activity scores) Goal status: NOT MET- 07/22/24  ASSESSMENT:  CLINICAL IMPRESSION: Patient has verbalized and demonstrated the necessary visual compensatory strategies to ensure safety at home and in the community, supporting continued independence in ADLs and IADLs. The patient is appropriate for discharge at this time, having met 2 out of 2 short-term goals and 2 out of 3 long-term goals.  Pt did not meet PSFS goal but was inconsistent with self- reported ratings even within 15 minutes of questioning ie) rating scanning ability from 50-70% .   PERFORMANCE DEFICITS: in functional skills including proprioception, sensation, decreased knowledge of precautions, decreased knowledge of use of DME, and vision, cognitive skills including problem solving and safety awareness, and psychosocial skills including coping strategies, environmental adaptation, and routines and behaviors.   IMPAIRMENTS: are limiting patient from work and social participation.   CO-MORBIDITIES: may have co-morbidities  that affects occupational performance. Patient will benefit from skilled OT to address above impairments and improve overall function.   OCCUPATIONAL THERAPY DISCHARGE SUMMARY  Visits from Start of Care: 6  Current functional level related to goals / functional outcomes: Pt has met/improved  in all goals to satisfactory levels and is pleased with outcomes.   Remaining deficits: Pt has slight functional deficits still with L visual field but is compensating well in all daily settings.   Education / Equipment: Pt has all needed materials and education. Pt understands how to continue on with self-management. See tx notes for more details.   Patient agrees to discharge due to max benefits received from outpatient occupational therapy at this time.     Oney Tatlock, Student-OT 07/22/2024, 2:52 PM

## 2024-07-29 ENCOUNTER — Encounter: Admitting: Occupational Therapy

## 2025-06-22 ENCOUNTER — Ambulatory Visit: Admitting: Adult Health
